# Patient Record
Sex: Female | Born: 1975 | Race: White | Hispanic: No | Smoking: Never smoker
Health system: Southern US, Community
[De-identification: ages and names within clinical notes are randomized; demographics above are authoritative.]

## PROBLEM LIST (undated history)

## (undated) DIAGNOSIS — R0602 Shortness of breath: Secondary | ICD-10-CM

## (undated) DIAGNOSIS — R079 Chest pain, unspecified: Secondary | ICD-10-CM

## (undated) DIAGNOSIS — E1165 Type 2 diabetes mellitus with hyperglycemia: Secondary | ICD-10-CM

## (undated) DIAGNOSIS — K5900 Constipation, unspecified: Secondary | ICD-10-CM

## (undated) DIAGNOSIS — R5383 Other fatigue: Secondary | ICD-10-CM

## (undated) DIAGNOSIS — E785 Hyperlipidemia, unspecified: Secondary | ICD-10-CM

## (undated) DIAGNOSIS — Z6832 Body mass index (BMI) 32.0-32.9, adult: Secondary | ICD-10-CM

## (undated) DIAGNOSIS — I10 Essential (primary) hypertension: Secondary | ICD-10-CM

## (undated) DIAGNOSIS — D691 Qualitative platelet defects: Secondary | ICD-10-CM

## (undated) DIAGNOSIS — E119 Type 2 diabetes mellitus without complications: Secondary | ICD-10-CM

## (undated) HISTORY — DX: Type 2 diabetes mellitus with hyperglycemia: E11.65

## (undated) HISTORY — DX: Shortness of breath: R06.02

## (undated) HISTORY — DX: Chest pain, unspecified: R07.9

## (undated) HISTORY — DX: Body mass index (BMI) 32.0-32.9, adult: Z68.32

## (undated) HISTORY — DX: Other fatigue: R53.83

## (undated) HISTORY — DX: Type 2 diabetes mellitus without complications: E11.9

## (undated) HISTORY — DX: Constipation, unspecified: K59.00

## (undated) HISTORY — DX: Qualitative platelet defects: D69.1

## (undated) HISTORY — DX: Hyperlipidemia, unspecified: E78.5

## (undated) HISTORY — DX: Essential (primary) hypertension: I10

---

## 1999-05-24 ENCOUNTER — Other Ambulatory Visit: Admission: RE | Admit: 1999-05-24 | Discharge: 1999-05-24 | Payer: Self-pay | Admitting: *Deleted

## 2000-08-08 ENCOUNTER — Other Ambulatory Visit: Admission: RE | Admit: 2000-08-08 | Discharge: 2000-08-08 | Payer: Self-pay | Admitting: Family Medicine

## 2005-03-06 ENCOUNTER — Other Ambulatory Visit: Admission: RE | Admit: 2005-03-06 | Discharge: 2005-03-06 | Payer: Self-pay | Admitting: Obstetrics & Gynecology

## 2005-06-14 ENCOUNTER — Inpatient Hospital Stay (HOSPITAL_COMMUNITY): Admission: AD | Admit: 2005-06-14 | Discharge: 2005-06-14 | Payer: Self-pay | Admitting: Obstetrics and Gynecology

## 2005-07-26 ENCOUNTER — Inpatient Hospital Stay (HOSPITAL_COMMUNITY): Admission: AD | Admit: 2005-07-26 | Discharge: 2005-07-26 | Payer: Self-pay | Admitting: Obstetrics & Gynecology

## 2005-07-29 ENCOUNTER — Inpatient Hospital Stay (HOSPITAL_COMMUNITY): Admission: AD | Admit: 2005-07-29 | Discharge: 2005-08-03 | Payer: Self-pay | Admitting: Obstetrics and Gynecology

## 2005-09-19 ENCOUNTER — Other Ambulatory Visit: Admission: RE | Admit: 2005-09-19 | Discharge: 2005-09-19 | Payer: Self-pay | Admitting: Obstetrics & Gynecology

## 2006-04-09 ENCOUNTER — Other Ambulatory Visit: Admission: RE | Admit: 2006-04-09 | Discharge: 2006-04-09 | Payer: Self-pay | Admitting: Obstetrics and Gynecology

## 2006-07-24 ENCOUNTER — Encounter: Admission: RE | Admit: 2006-07-24 | Discharge: 2006-07-24 | Payer: Self-pay | Admitting: Obstetrics and Gynecology

## 2006-09-05 ENCOUNTER — Inpatient Hospital Stay (HOSPITAL_COMMUNITY): Admission: AD | Admit: 2006-09-05 | Discharge: 2006-09-07 | Payer: Self-pay | Admitting: Obstetrics and Gynecology

## 2016-10-12 ENCOUNTER — Emergency Department (HOSPITAL_COMMUNITY)
Admission: EM | Admit: 2016-10-12 | Discharge: 2016-10-12 | Disposition: A | Discharge status: Home / Self Care | Payer: Medicaid Other | Attending: Emergency Medicine | Admitting: Emergency Medicine

## 2016-10-12 ENCOUNTER — Encounter (HOSPITAL_COMMUNITY): Payer: Self-pay | Admitting: *Deleted

## 2016-10-12 ENCOUNTER — Emergency Department (HOSPITAL_COMMUNITY): Payer: Medicaid Other

## 2016-10-12 DIAGNOSIS — S161XXA Strain of muscle, fascia and tendon at neck level, initial encounter: Secondary | ICD-10-CM | POA: Diagnosis not present

## 2016-10-12 DIAGNOSIS — Y999 Unspecified external cause status: Secondary | ICD-10-CM | POA: Diagnosis not present

## 2016-10-12 DIAGNOSIS — Y939 Activity, unspecified: Secondary | ICD-10-CM | POA: Diagnosis not present

## 2016-10-12 DIAGNOSIS — S93401A Sprain of unspecified ligament of right ankle, initial encounter: Secondary | ICD-10-CM | POA: Insufficient documentation

## 2016-10-12 DIAGNOSIS — S199XXA Unspecified injury of neck, initial encounter: Secondary | ICD-10-CM | POA: Diagnosis present

## 2016-10-12 DIAGNOSIS — W11XXXA Fall on and from ladder, initial encounter: Secondary | ICD-10-CM | POA: Diagnosis not present

## 2016-10-12 DIAGNOSIS — W19XXXA Unspecified fall, initial encounter: Secondary | ICD-10-CM

## 2016-10-12 DIAGNOSIS — Y929 Unspecified place or not applicable: Secondary | ICD-10-CM | POA: Insufficient documentation

## 2016-10-12 MED ORDER — METHOCARBAMOL 500 MG PO TABS: 500.0000 mg | ORAL_TABLET | Freq: Two times a day (BID) | ORAL | 0 refills | Status: DC | PRN

## 2016-10-12 MED ORDER — IBUPROFEN 800 MG PO TABS
800.0000 mg | ORAL_TABLET | Freq: Once | ORAL | Status: AC
  Administered 2016-10-12: 800 mg via ORAL
  Filled 2016-10-12: qty 1

## 2016-10-12 MED ORDER — IBUPROFEN 800 MG PO TABS: 800.0000 mg | ORAL_TABLET | Freq: Three times a day (TID) | ORAL | 0 refills | Status: DC | PRN

## 2016-10-12 NOTE — ED Triage Notes (Signed)
The pt fell 0900 am this am  She was inside standing on a ladder missed step   And fell onto the floor.  She is c/o neck pain upper and lower spine pain and rt foot  lmp 3 weeks ago

## 2016-10-12 NOTE — ED Notes (Signed)
Pt. Returned from XR 

## 2016-10-12 NOTE — ED Notes (Signed)
Patient transported to X-ray 

## 2016-10-12 NOTE — ED Triage Notes (Signed)
No loc

## 2016-10-12 NOTE — ED Notes (Signed)
ED Provider at bedside. 

## 2016-10-12 NOTE — Progress Notes (Signed)
Orthopedic Tech Progress Note Patient Details:  Lehman PromMelinda A Hardigree 10-24-1976 [id number]  Ortho Devices Type of Ortho Device: ASO Ortho Device/Splint Location: rle Ortho Device/Splint Interventions: Ordered, Application   Trinna PostMartinez, Emanie Behan J 10/12/2016, 9:20 PM

## 2016-10-12 NOTE — Discharge Instructions (Signed)
Ibuprofen as needed for pain. Robaxin is your muscle relaxer to take as needed for pain/muscle spasms - This can make you very drowsy - please do not drink alcohol, operate heavy machinery or drive on this medication. Please follow-up with your primary care provider if symptoms persist longer than one week. Ice affected area for additional pain relief. Return to ER for new or worsening symptoms, any additional concerns.  COLD THERAPY DIRECTIONS:  Ice or gel packs can be used to reduce both pain and swelling. Ice is the most helpful within the first 24 to 48 hours after an injury or flareup from overusing a muscle or joint.  Ice is effective, has very few side effects, and is safe for most people to use.   If you expose your skin to cold temperatures for too long or without the proper protection, you can damage your skin or nerves. Watch for signs of skin damage due to cold.   HOME CARE INSTRUCTIONS  Follow these tips to use ice and cold packs safely.  Place a dry or damp towel between the ice and skin. A damp towel will cool the skin more quickly, so you may need to shorten the time that the ice is used.  For a more rapid response, add gentle compression to the ice.  Ice for no more than 10 to 20 minutes at a time. The bonier the area you are icing, the less time it will take to get the benefits of ice.  Check your skin after 5 minutes to make sure there are no signs of a poor response to cold or skin damage.  Rest 20 minutes or more in between uses.  Once your skin is numb, you can end your treatment. You can test numbness by very lightly touching your skin. The touch should be so light that you do not see the skin dimple from the pressure of your fingertip. When using ice, most people will feel these normal sensations in this order: cold, burning, aching, and numbness.

## 2016-10-12 NOTE — ED Provider Notes (Signed)
[facility]-EMERGENCY DEPT Provider Note   CSN: [id number] Arrival date & time: 10/12/16  1643     History   Chief Complaint Chief Complaint  Patient presents with  . Fall    HPI Vanessa Parks is a 40 y.o. female.  The history is provided by the patient and medical records. No language interpreter was used.  Fall  Pertinent negatives include no abdominal pain, no headaches and no shortness of breath.   Vanessa Parks is an otherwise healthy 40 y.o. female  who presents to the Emergency Department for evaluation after a fall at 9am this morning. Patient states that she was standing on a ladder when she missed a step, hitting her low back on the ladder and her right neck on the window pane next to the ladder. She also believes her right foot twisted oddly during the fall. She took a nap, hoping to "sleep it off" but when she awoke, neck/back/right foot pain worsened, emptying her to come to ER for further evaluation. She has taken no medications prior to arrival for pain. She denies head injury, loss of consciousness, saddle anesthesia, numbness or tingling, muscle weakness, abdominal pain, any additional complaints.  History reviewed. No pertinent past medical history.  There are no active problems to display for this patient.   History reviewed. No pertinent surgical history.  OB History    No data available       Home Medications    Prior to Admission medications   Medication Sig Start Date End Date Taking? Authorizing Provider  ibuprofen (ADVIL,MOTRIN) 800 MG tablet Take 1 tablet (800 mg total) by mouth every 8 (eight) hours as needed. 10/12/16   Chase Picket Link Burgeson, PA-C  methocarbamol (ROBAXIN) 500 MG tablet Take 1 tablet (500 mg total) by mouth 2 (two) times daily as needed for muscle spasms. 10/12/16   Chase Picket Katherina Wimer, PA-C    Family History No family history on file.  Social History Social History  Substance Use Topics  . Smoking status: Never Smoker  .  Smokeless tobacco: Never Used  . Alcohol use No     Allergies   Review of patient's allergies indicates no known allergies.   Review of Systems Review of Systems  Constitutional: Negative for chills and fever.  HENT: Negative for congestion.   Eyes: Negative for visual disturbance.  Respiratory: Negative for shortness of breath.   Cardiovascular: Negative.   Gastrointestinal: Negative for abdominal pain, nausea and vomiting.  Musculoskeletal: Positive for arthralgias, back pain and neck pain.  Skin: Negative for color change and wound.  Neurological: Negative for syncope and headaches.  Hematological: Does not bruise/bleed easily.     Physical Exam Updated Vital Signs BP 136/82 (BP Location: Right Arm)   Pulse 98   Temp 98.5 F (36.9 C) (Oral)   Resp 18   Ht 5' 8.5" (1.74 m)   Wt 99.4 kg   LMP 09/21/2016 Comment: tubal ligation  SpO2 100%   BMI 32.82 kg/m   Physical Exam  Constitutional: She is oriented to person, place, and time. She appears well-developed and well-nourished. No distress.  HENT:  Head: Normocephalic and atraumatic.  Neck:  No midline tenderness. Tenderness to palpation of the left paraspinal musculature. + spasm.  Cardiovascular: Normal rate, regular rhythm, normal heart sounds and intact distal pulses.   No murmur heard. Pulmonary/Chest: Effort normal and breath sounds normal. No respiratory distress. She has no wheezes. She has no rales. She exhibits no tenderness.  Abdominal: Soft. Bowel  sounds are normal. She exhibits no distension. There is no tenderness.  Musculoskeletal:       Arms: Right foot/ankle: No gross deformity noted. Patient has full ROM. There is no joint effusion noted. There is tenderness to palpation over the medial malleolus. No pain to fifth metatarsal area, no pain to navicular region. 2+ DP pulses, sensation intact to medial, lateral, dorsal and plantar aspects. Straight leg raises negative bilaterally for radicular  symptoms. 5/5 muscle strength 4 extremities.  Neurological: She is alert and oriented to person, place, and time.  All 4 extremities neurovascularly intact.  Skin: Skin is warm and dry.  Nursing note and vitals reviewed.    ED Treatments / Results  Labs (all labs ordered are listed, but only abnormal results are displayed) Labs Reviewed - No data to display  EKG  EKG Interpretation None       Radiology Dg Lumbar Spine Complete  Result Date: 10/12/2016 CLINICAL DATA:  Fall.  Low back pain. EXAM: LUMBAR SPINE - COMPLETE 4+ VIEW COMPARISON:  None. FINDINGS: This report assumes 5 non rib-bearing lumbar vertebrae. Lumbar vertebral body heights are preserved, with no fracture. Mild spondylosis deformans throughout the lumbar spine. No spondylolisthesis. No appreciable facet arthropathy. No aggressive appearing focal osseous lesions. IMPRESSION: 1. No lumbar spine fracture or spondylolisthesis. 2. Mild lumbar spondylosis deformans. Electronically Signed   By: Delbert PhenixJason A Poff M.D.   On: 10/12/2016 18:58   Dg Ankle Complete Right  Result Date: 10/12/2016 CLINICAL DATA:  Initial encounter for Medial and lateral right ankle pain x6 hours s/p fall inside on hardwood flooring. Pt states she lost her balance and fell while trying to sit in a chair. No hx of right ankle injuries or surgeries. EXAM: RIGHT ANKLE - COMPLETE 3+ VIEW COMPARISON:  None. FINDINGS: No acute fracture or dislocation. Small Achilles and calcaneal spurs. No definite soft tissue swelling. IMPRESSION: No acute osseous abnormality. Electronically Signed   By: Jeronimo GreavesKyle  Talbot M.D.   On: 10/12/2016 18:56    Procedures Procedures (including critical care time)  Medications Ordered in ED Medications  ibuprofen (ADVIL,MOTRIN) tablet 800 mg (800 mg Oral Given 10/12/16 1814)     Initial Impression / Assessment and Plan / ED Course  I have reviewed the triage vital signs and the nursing notes.  Pertinent labs & imaging results that  were available during my care of the patient were reviewed by me and considered in my medical decision making (see chart for details).  Clinical Course   Vanessa Parks is a 40 y.o. female who presents to ED for evaluation of neck pain, back pain and right foot pain following a fall earlier this morning. On exam, patient is very well-appearing. Neck exam consistent with left muscle strain. Ibuprofen given in ED which provided adequate relief. She does exhibit some midline lumbar tenderness along with paraspinal tenderness. X-ray obtained which shows no acute abnormalities. Ankle x-ray obtained which was negative, likely sprain. ASO brace provided in ED. Offered crutches, but patient states that she can walk without assistance.  Symptomatic home care instructions were discussed. Rx for ibuprofen and Robaxin given. PCP follow-up if her symptoms persist. Reasons to return to ED discussed and all questions answered.   Final Clinical Impressions(s) / ED Diagnoses   Final diagnoses:  Strain of neck muscle, initial encounter  Fall, initial encounter  Sprain of right ankle, unspecified ligament, initial encounter    New Prescriptions New Prescriptions   IBUPROFEN (ADVIL,MOTRIN) 800 MG TABLET    Take  1 tablet (800 mg total) by mouth every 8 (eight) hours as needed.   METHOCARBAMOL (ROBAXIN) 500 MG TABLET    Take 1 tablet (500 mg total) by mouth 2 (two) times daily as needed for muscle spasms.     Chase PicketJaime Pilcher Jovannie Ulibarri, PA-C 10/12/16 1931    Shaune Pollackameron Isaacs, MD 10/13/16 [phone]

## 2017-08-16 IMAGING — DX DG LUMBAR SPINE COMPLETE 4+V
5 series · 5 of 5 positions shown · non-contrast
Comparison: None.

CLINICAL DATA: Fall.  Low back pain.

EXAM:
LUMBAR SPINE - COMPLETE 4+ VIEW

[l-spine ap]
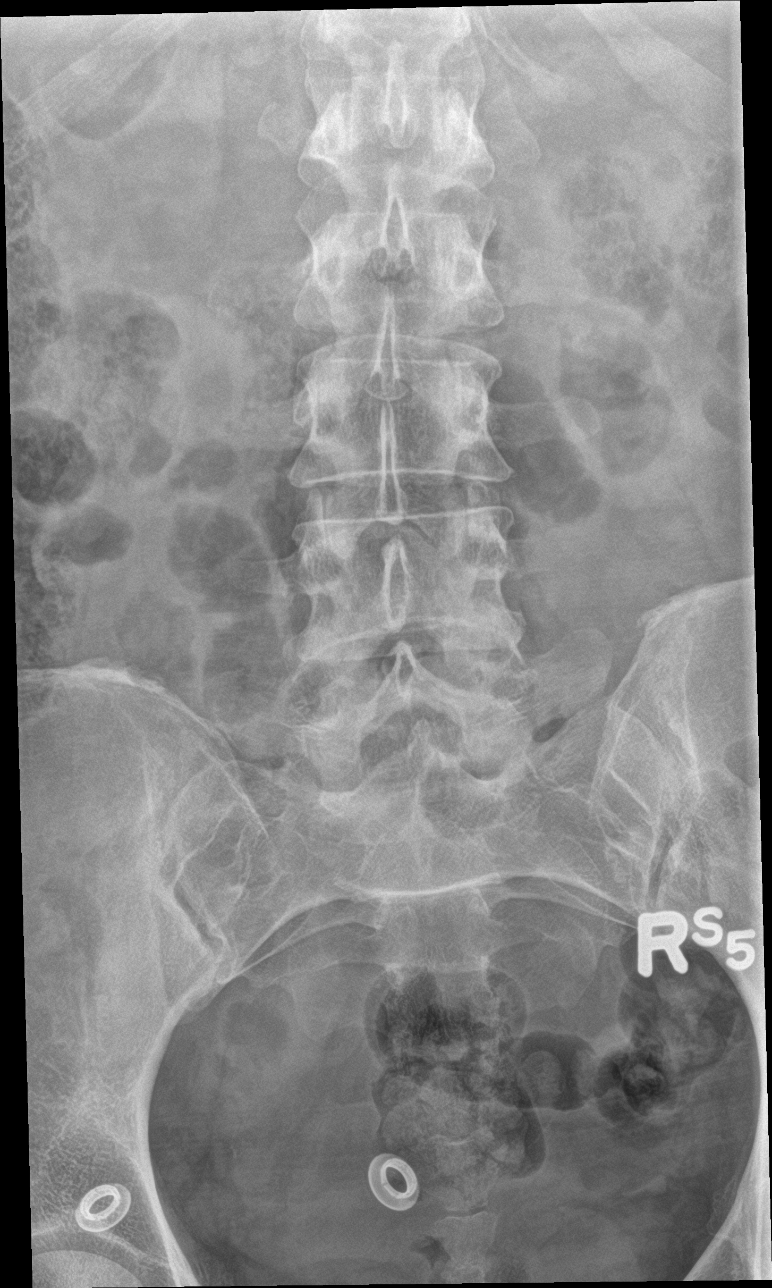

[l-spine obl (1 of 2)]
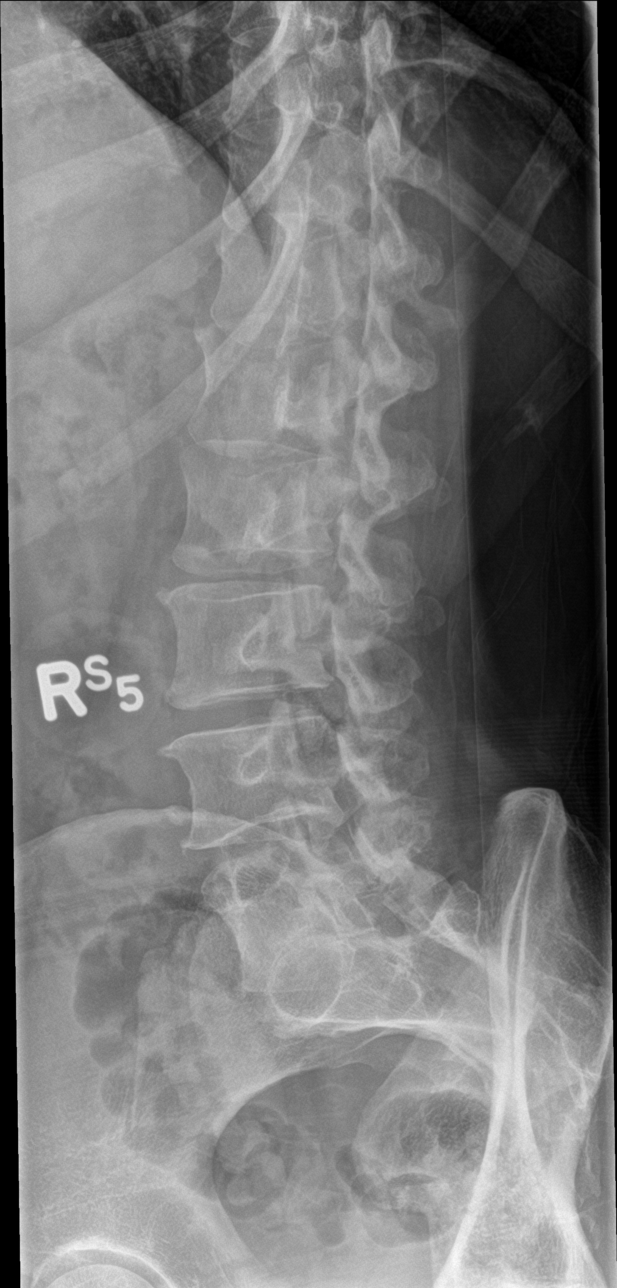

[l-spine obl (2 of 2)]
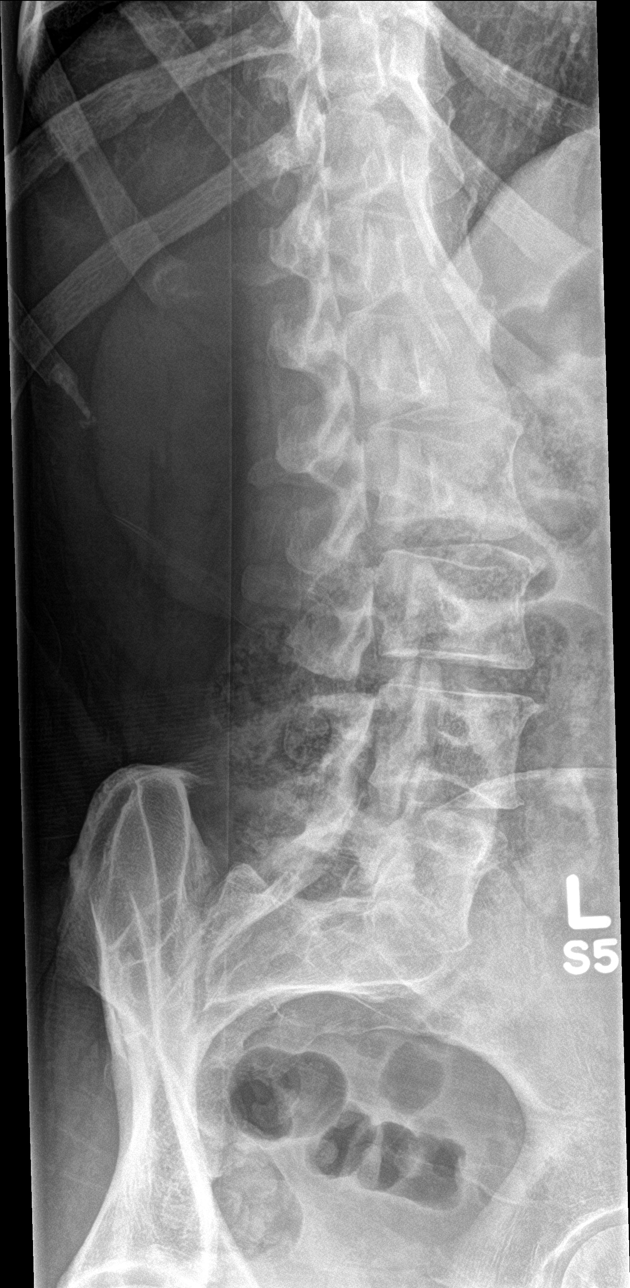

[l-spine lat]
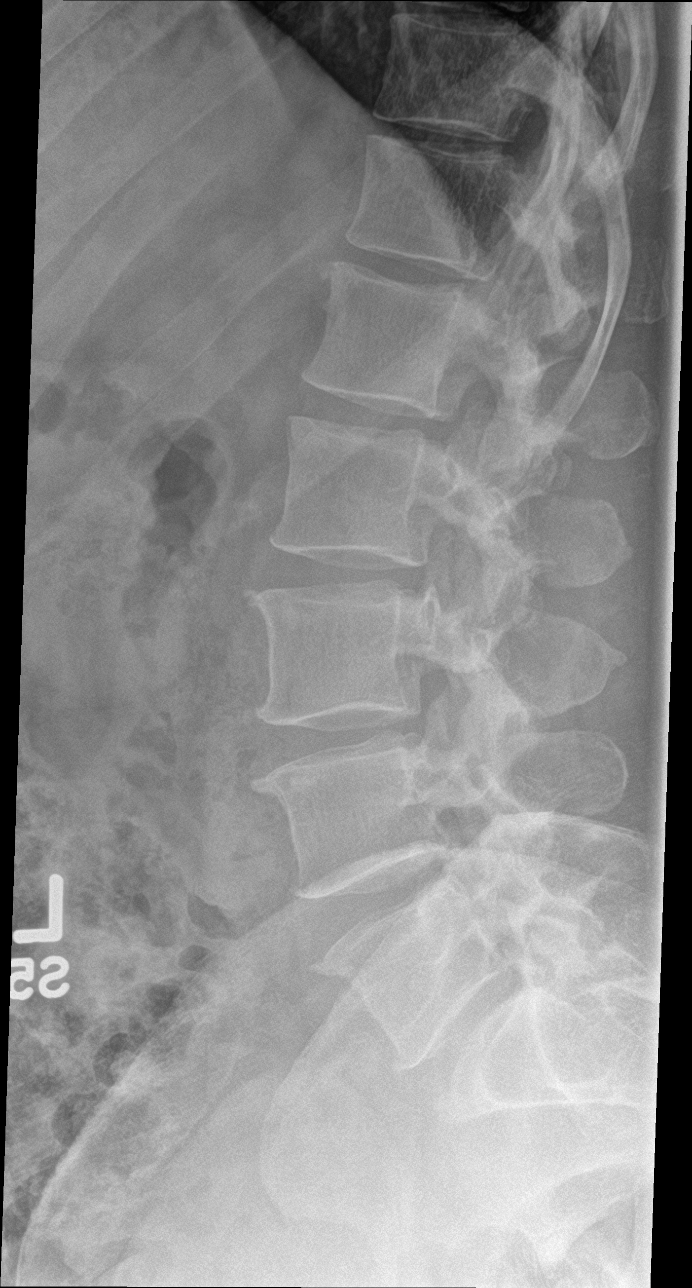

[l-spine spot]
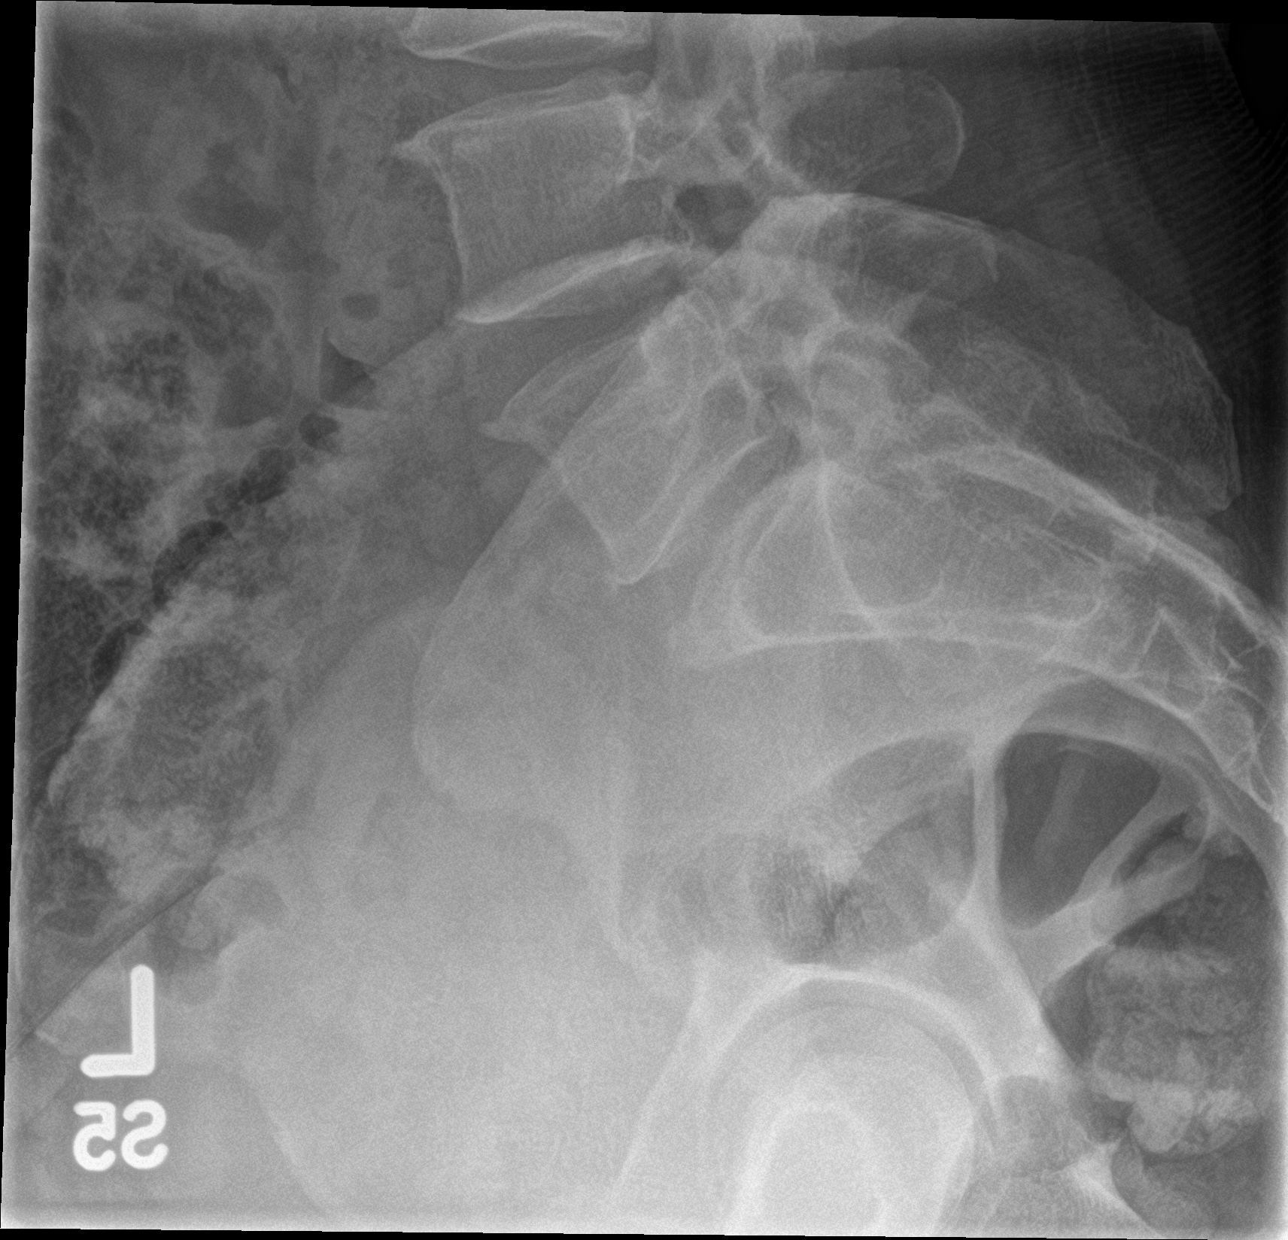

[5 of 5 positions shown; findings below may reference images not displayed]

FINDINGS: This report assumes 5 non rib-bearing lumbar vertebrae.

Lumbar vertebral body heights are preserved, with no fracture.

Mild spondylosis deformans throughout the lumbar spine. No
spondylolisthesis. No appreciable facet arthropathy. No aggressive
appearing focal osseous lesions.
IMPRESSION: 1. No lumbar spine fracture or spondylolisthesis.
2. Mild lumbar spondylosis deformans.

## 2019-01-09 HISTORY — PX: PARTIAL HYSTERECTOMY: SHX80

## 2019-06-03 DIAGNOSIS — D509 Iron deficiency anemia, unspecified: Secondary | ICD-10-CM | POA: Diagnosis not present

## 2020-08-28 DIAGNOSIS — Z90710 Acquired absence of both cervix and uterus: Secondary | ICD-10-CM

## 2020-08-28 HISTORY — DX: Acquired absence of both cervix and uterus: Z90.710

## 2022-06-04 ENCOUNTER — Other Ambulatory Visit: Payer: Self-pay

## 2022-06-04 DIAGNOSIS — K5900 Constipation, unspecified: Secondary | ICD-10-CM | POA: Insufficient documentation

## 2022-06-04 DIAGNOSIS — I10 Essential (primary) hypertension: Secondary | ICD-10-CM | POA: Insufficient documentation

## 2022-06-04 DIAGNOSIS — E1165 Type 2 diabetes mellitus with hyperglycemia: Secondary | ICD-10-CM | POA: Insufficient documentation

## 2022-06-04 DIAGNOSIS — D691 Qualitative platelet defects: Secondary | ICD-10-CM | POA: Insufficient documentation

## 2022-06-04 DIAGNOSIS — R0602 Shortness of breath: Secondary | ICD-10-CM | POA: Insufficient documentation

## 2022-06-04 DIAGNOSIS — E785 Hyperlipidemia, unspecified: Secondary | ICD-10-CM | POA: Insufficient documentation

## 2022-06-04 DIAGNOSIS — Z6832 Body mass index (BMI) 32.0-32.9, adult: Secondary | ICD-10-CM | POA: Insufficient documentation

## 2022-06-04 DIAGNOSIS — R079 Chest pain, unspecified: Secondary | ICD-10-CM | POA: Insufficient documentation

## 2022-06-04 DIAGNOSIS — E119 Type 2 diabetes mellitus without complications: Secondary | ICD-10-CM | POA: Insufficient documentation

## 2022-06-04 DIAGNOSIS — R5383 Other fatigue: Secondary | ICD-10-CM | POA: Insufficient documentation

## 2022-06-07 ENCOUNTER — Emergency Department (HOSPITAL_COMMUNITY): Payer: Medicaid Other

## 2022-06-07 ENCOUNTER — Other Ambulatory Visit: Payer: Self-pay

## 2022-06-07 ENCOUNTER — Encounter (HOSPITAL_COMMUNITY): Payer: Self-pay

## 2022-06-07 ENCOUNTER — Emergency Department (HOSPITAL_COMMUNITY)
Admission: EM | Admit: 2022-06-07 | Discharge: 2022-06-07 | Disposition: A | Discharge status: Home / Self Care | Payer: Medicaid Other | Attending: Emergency Medicine | Admitting: Emergency Medicine

## 2022-06-07 DIAGNOSIS — R0789 Other chest pain: Secondary | ICD-10-CM | POA: Insufficient documentation

## 2022-06-07 DIAGNOSIS — E1165 Type 2 diabetes mellitus with hyperglycemia: Secondary | ICD-10-CM | POA: Diagnosis not present

## 2022-06-07 DIAGNOSIS — R002 Palpitations: Secondary | ICD-10-CM | POA: Diagnosis not present

## 2022-06-07 DIAGNOSIS — I10 Essential (primary) hypertension: Secondary | ICD-10-CM | POA: Insufficient documentation

## 2022-06-07 DIAGNOSIS — R739 Hyperglycemia, unspecified: Secondary | ICD-10-CM

## 2022-06-07 DIAGNOSIS — Z794 Long term (current) use of insulin: Secondary | ICD-10-CM | POA: Diagnosis not present

## 2022-06-07 DIAGNOSIS — Z79899 Other long term (current) drug therapy: Secondary | ICD-10-CM | POA: Diagnosis not present

## 2022-06-07 DIAGNOSIS — M5440 Lumbago with sciatica, unspecified side: Secondary | ICD-10-CM | POA: Diagnosis not present

## 2022-06-07 LAB — D-DIMER, QUANTITATIVE: D-Dimer, Quant: <0.27 ug/mL-FEU (ref 0.00–0.50)

## 2022-06-07 LAB — CBC
HCT: 45.7 % (ref 36.0–46.0)
Hemoglobin: 15 g/dL (ref 12.0–15.0)
MCH: 27.9 pg (ref 26.0–34.0)
MCHC: 32.8 g/dL (ref 30.0–36.0)
MCV: 84.9 fL (ref 80.0–100.0)
Platelets: 253 10*3/uL (ref 150–400)
RBC: 5.38 MIL/uL — ABNORMAL HIGH (ref 3.87–5.11)
RDW: 12.6 % (ref 11.5–15.5)
WBC: 8.9 10*3/uL (ref 4.0–10.5)
nRBC: 0 % (ref 0.0–0.2)

## 2022-06-07 LAB — TROPONIN I (HIGH SENSITIVITY)
Troponin I (High Sensitivity): 4 ng/L (ref <18)
Troponin I (High Sensitivity): 3 ng/L (ref <18)

## 2022-06-07 LAB — BASIC METABOLIC PANEL
Anion gap: 10 (ref 5–15)
BUN: 6 mg/dL (ref 6–20)
CO2: 25 mmol/L (ref 22–32)
Calcium: 9.3 mg/dL (ref 8.9–10.3)
Chloride: 102 mmol/L (ref 98–111)
Creatinine, Ser: 0.73 mg/dL (ref 0.44–1.00)
GFR, Estimated: >60 mL/min (ref >60)
Glucose, Bld: 190 mg/dL — ABNORMAL HIGH (ref 70–99)
Potassium: 4.2 mmol/L (ref 3.5–5.1)
Sodium: 137 mmol/L (ref 135–145)

## 2022-06-07 LAB — I-STAT BETA HCG BLOOD, ED (MC, WL, AP ONLY): I-stat hCG, quantitative: <5 m[IU]/mL (ref <5)

## 2022-06-07 MED ORDER — IBUPROFEN 600 MG PO TABS: 600.0000 mg | ORAL_TABLET | Freq: Four times a day (QID) | ORAL | 0 refills | Status: AC | PRN

## 2022-06-07 MED ORDER — CYCLOBENZAPRINE HCL 10 MG PO TABS: 10.0000 mg | ORAL_TABLET | Freq: Two times a day (BID) | ORAL | 0 refills | Status: DC | PRN

## 2022-06-07 MED ORDER — IBUPROFEN 200 MG PO TABS
600.0000 mg | ORAL_TABLET | Freq: Once | ORAL | Status: AC
  Administered 2022-06-07: 600 mg via ORAL
  Filled 2022-06-07: qty 1

## 2022-06-07 NOTE — ED Provider Triage Note (Signed)
Emergency Medicine Provider Triage Evaluation Note  Vanessa Parks , a 46 y.o. female  was evaluated in triage.  Pt complains of CP for one week w SOB and nausea without vomiting.    Review of Systems  Positive: CP/SOB, nauseated Negative: Fever   Physical Exam  BP (!) 140/93 (BP Location: Left Arm)   Pulse (!) 106   Temp 98 F (36.7 C) (Oral)   Resp 16   Ht 5' 8.5" (1.74 m)   Wt 99 kg   SpO2 98%   BMI 32.70 kg/m  Gen:   Awake, no distress   Resp:  Normal effort  MSK:   Moves extremities without difficulty  Other:  Lungs clear  Medical Decision Making  Medically screening exam initiated at 9:25 AM.  Appropriate orders placed.  Vanessa Parks was informed that the remainder of the evaluation will be completed by another provider, this initial triage assessment does not replace that evaluation, and the importance of remaining in the ED until their evaluation is complete.  Labs and ekg   Vanessa Parks, [place] 06/07/22 [phone]

## 2022-06-07 NOTE — ED Triage Notes (Signed)
Pt arrives POV for eval of  R chest pain x 1 week w/ associated SOB and nausea, also reports so R thigh numbnesstingling x 3 months. Reports CP x 1 week, denies alleviating/aggravating factors. Pt reports thigh tingling is getting worse and last night radiated to other thigh

## 2022-06-07 NOTE — ED Notes (Signed)
Pt reported their pulse was 150, with their personal equipment, vitals rechecked and pulse was 99. Pt told to notify staff if it were to happen again.

## 2022-06-07 NOTE — ED Provider Notes (Signed)
[facility] EMERGENCY DEPARTMENT Provider Note   CSN: [id number] Arrival date & time: 06/07/22  0901     History  Chief Complaint  Patient presents with   Chest Pain    Vanessa Parks is a 46 y.o. female.   Chest Pain   Patient presents to the ED with complaints of palpitations and right-sided chest discomfort.  Patient states she has been having intermittent symptoms for the past week.  Patient will feel pounding on the right side of her chest.  She has not been having any fevers or chills.  No shortness of breath.  No leg swelling.  Patient does have a history of hypertension and diabetes as well as hypercholesterolemia.  No known history of heart disease.  No history of PE or DVT.  Patient states she has checked her heart rate at home and it was up into the 115.  However she states she had an episode today where the pulse was about 150 based on a home pulse ox monitor.  Patient states she is also has been having some pain in the lower back.  She has had some discomfort that is gone into her left leg but more recently it went into her right leg as well.  She is not having any numbness or weakness.  No difficulty walking.  She has not noticed any leg swelling  Home Medications Prior to Admission medications   Medication Sig Start Date End Date Taking? Authorizing Provider  cyclobenzaprine (FLEXERIL) 10 MG tablet Take 1 tablet (10 mg total) by mouth 2 (two) times daily as needed for muscle spasms. 06/07/22  Yes Linwood Dibbles, MD  ibuprofen (ADVIL) 600 MG tablet Take 1 tablet (600 mg total) by mouth every 6 (six) hours as needed. 06/07/22  Yes Linwood Dibbles, MD  insulin aspart (NOVOLOG) 100 UNIT/ML FlexPen Inject 12 Units into the skin as needed for high blood sugar. Sliding scale    [provider]  linaclotide (LINZESS) 145 MCG CAPS capsule Take 1 tablet by mouth daily. 08/10/20   [provider]  methocarbamol (ROBAXIN) 500 MG tablet Take 1 tablet (500  mg total) by mouth 2 (two) times daily as needed for muscle spasms. 10/12/16   Ward, Chase Picket, PA-C  olmesartan (BENICAR) 20 MG tablet Take 20 mg by mouth daily. 05/29/22   [provider]  OZEMPIC, 0.25 OR 0.5 MG/DOSE, 2 MG/3ML SOPN Inject into the skin. 06/03/22   [provider]  rosuvastatin (CRESTOR) 10 MG tablet Take 10 mg by mouth at bedtime. 05/30/22   [provider]      Allergies    Lipitor [atorvastatin] and Victoza [liraglutide]    Review of Systems   Review of Systems  Cardiovascular:  Positive for chest pain.    Physical Exam Updated Vital Signs BP (!) 148/96 (BP Location: Right Arm)   Pulse 100   Temp 98.4 F (36.9 C) (Oral)   Resp 18   Ht 1.74 m (5' 8.5")   Wt 99 kg   SpO2 98%   BMI 32.70 kg/m  Physical Exam Vitals and nursing note reviewed.  Constitutional:      General: She is not in acute distress.    Appearance: She is well-developed.  HENT:     Head: Normocephalic and atraumatic.     Right Ear: External ear normal.     Left Ear: External ear normal.  Eyes:     General: No scleral icterus.  Right eye: No discharge.        Left eye: No discharge.     Conjunctiva/sclera: Conjunctivae normal.  Neck:     Trachea: No tracheal deviation.  Cardiovascular:     Rate and Rhythm: Normal rate and regular rhythm.  Pulmonary:     Effort: Pulmonary effort is normal. No respiratory distress.     Breath sounds: Normal breath sounds. No stridor. No wheezing or rales.  Abdominal:     General: Bowel sounds are normal. There is no distension.     Palpations: Abdomen is soft.     Tenderness: There is no abdominal tenderness. There is no guarding or rebound.  Musculoskeletal:        General: No tenderness or deformity.     Cervical back: Neck supple.     Right lower leg: No tenderness. No edema.     Left lower leg: No tenderness. No edema.     Comments: , Mild tenderness palpation paraspinal region lumbar spine  Skin:     General: Skin is warm and dry.     Findings: No rash.  Neurological:     General: No focal deficit present.     Mental Status: She is alert.     Cranial Nerves: No cranial nerve deficit (no facial droop, extraocular movements intact, no slurred speech).     Sensory: No sensory deficit.     Motor: No abnormal muscle tone or seizure activity.     Coordination: Coordination normal.     Comments: Normal strength and sensation bilateral lower extremities  Psychiatric:        Mood and Affect: Mood normal.     ED Results / Procedures / Treatments   Labs (all labs ordered are listed, but only abnormal results are displayed) Labs Reviewed  BASIC METABOLIC PANEL - Abnormal; Notable for the following components:      Result Value   Glucose, Bld 190 (*)    All other components within normal limits  CBC - Abnormal; Notable for the following components:   RBC 5.38 (*)    All other components within normal limits  D-DIMER, QUANTITATIVE  I-STAT BETA HCG BLOOD, ED ([facility], [facility], [facility] ONLY)  TROPONIN I (HIGH SENSITIVITY)  TROPONIN I (HIGH SENSITIVITY)    EKG EKG Interpretation  Date/Time:  Friday June 07 2022 09:19:07 EDT Ventricular Rate:  94 PR Interval:  156 QRS Duration: 78 QT Interval:  354 QTC Calculation: 442 R Axis:   52 Text Interpretation: Normal sinus rhythm Septal infarct , age undetermined Abnormal ECG No previous ECGs available Confirmed by Linwood Dibbles [phone]) on 06/07/2022 4:25:44 PM  Radiology DG Chest 2 View  Result Date: 06/07/2022 CLINICAL DATA:  Provided history: Chest pain. Additional history provided: Patient reports right-sided chest pain for 1 week with associated shortness of breath and nausea. EXAM: CHEST - 2 VIEW COMPARISON:  Prior chest radiograph 05/29/2022 and earlier. FINDINGS: Heart size within normal limits. No appreciable airspace consolidation. No evidence of pleural effusion or pneumothorax. No acute bony abnormality identified. Mild dextrocurvature of the mid  to lower thoracic spine. IMPRESSION: No evidence of acute cardiopulmonary abnormality. Electronically Signed   By: Jackey Loge D.O.   On: 06/07/2022 09:51    Procedures Procedures    Medications Ordered in ED Medications  ibuprofen (ADVIL) tablet 600 mg (has no administration in time range)    ED Course/ Medical Decision Making/ A&P Clinical Course as of 06/07/22 1705  Fri Jun 07, 2022  1545 Basic metabolic  panel(!) Blood sugar elevated [JK]  1545 CBC(!) Normal [JK]  1545 D-dimer, quantitative Normal [JK]  1545 Chest x-ray images and radiology report reviewed no acute findings [JK]  1545 Troponin I (High Sensitivity) Serial troponins normal [JK]    Clinical Course User Index [JK] Linwood Dibbles, MD           HEART Score: 3                Medical Decision Making Amount and/or Complexity of Data Reviewed Labs: ordered. Decision-making details documented in ED Course. Radiology: ordered and independent interpretation performed. Decision-making details documented in ED Course.  Risk Prescription drug management.   Patient presented to the ED for evaluation of palpitations, chest discomfort as well as lower back pain.  Patient described episodes of tachycardia and right-sided chest discomfort.  ED work-up reassuring.  Low risk heart score.  No findings to suggest PE, acute coronary syndrome.  No pneumonia or pneumothorax.  Patient describes episode of tachycardia.  She may benefit from outpatient cardiac monitoring.  Patient also complained of some low back pain.  She is having some pain rating down her legs.  Suggest.  She has normal strength and sensation.  Doubt any acute neurologic dysfunction requiring more advanced imaging or neurosurgical intervention.  Evaluation and diagnostic testing in the emergency department does not suggest an emergent condition requiring admission or immediate intervention beyond what has been performed at this time.  The patient is safe for discharge  and has been instructed to return immediately for worsening symptoms, change in symptoms or any other concerns.         Final Clinical Impression(s) / ED Diagnoses Final diagnoses:  Palpitations  Hyperglycemia  Acute low back pain with sciatica, sciatica laterality unspecified, unspecified back pain laterality    Rx / DC Orders ED Discharge Orders          Ordered    ibuprofen (ADVIL) 600 MG tablet  Every 6 hours PRN        06/07/22 1652    cyclobenzaprine (FLEXERIL) 10 MG tablet  2 times daily PRN        06/07/22 1652              Linwood Dibbles, MD 06/07/22 1705

## 2022-06-07 NOTE — Discharge Instructions (Addendum)
The test in the ED today were reassuring.  Take the medications as prescribed to help with the pain and discomfort of your possible sciatica.  Follow-up with your primary care doctor to follow-up on your palpitations and chest discomfort.  If you continue to have episodes it would be reasonable to have an outpatient cardiac monitor set up.

## 2022-07-04 ENCOUNTER — Ambulatory Visit (INDEPENDENT_AMBULATORY_CARE_PROVIDER_SITE_OTHER): Payer: Medicaid Other | Admitting: Cardiovascular Disease

## 2022-07-04 ENCOUNTER — Encounter: Payer: Self-pay | Admitting: Cardiovascular Disease

## 2022-07-04 VITALS — BP 113/77 | HR 87 | Ht 68.0 in | Wt 210.4 lb

## 2022-07-04 DIAGNOSIS — R072 Precordial pain: Secondary | ICD-10-CM | POA: Diagnosis not present

## 2022-07-04 DIAGNOSIS — I10 Essential (primary) hypertension: Secondary | ICD-10-CM

## 2022-07-04 DIAGNOSIS — E1165 Type 2 diabetes mellitus with hyperglycemia: Secondary | ICD-10-CM

## 2022-07-04 DIAGNOSIS — E7800 Pure hypercholesterolemia, unspecified: Secondary | ICD-10-CM | POA: Diagnosis not present

## 2022-07-04 MED ORDER — METOPROLOL TARTRATE 100 MG PO TABS: ORAL_TABLET | ORAL | 0 refills | Status: DC

## 2022-07-04 MED ORDER — CYCLOBENZAPRINE HCL 10 MG PO TABS: 10.0000 mg | ORAL_TABLET | Freq: Three times a day (TID) | ORAL | 0 refills | Status: DC | PRN

## 2022-07-04 NOTE — Patient Instructions (Signed)
Medication Instructions:  No changes  *If you need a refill on your cardiac medications before your next appointment, please call your pharmacy*   Lab Work: Your provider would like for you to have the following labs today: BMET  If you have labs (blood work) drawn today and your tests are completely normal, you will receive your results only by: MyChart Message (if you have MyChart) OR A paper copy in the mail If you have any lab test that is abnormal or we need to change your treatment, we will call you to review the results.   Follow-Up: At [facility], you and your health needs are our priority.  As part of our continuing mission to provide you with exceptional heart care, we have created designated Provider Care Teams.  These Care Teams include your primary Cardiologist (physician) and Advanced Practice Providers (APPs -  Physician Assistants and Nurse Practitioners) who all work together to provide you with the care you need, when you need it.  We recommend signing up for the patient portal called "MyChart".  Sign up information is provided on this After Visit Summary.  MyChart is used to connect with patients for Virtual Visits (Telemedicine).  Patients are able to view lab/test results, encounter notes, upcoming appointments, etc.  Non-urgent messages can be sent to your provider as well.   To learn more about what you can do with MyChart, go to [email or web address].    Your next appointment:   3 month(s)  The format for your next appointment:   In Person  Provider:   Dr. Royann Shivers  Other Instructions   Your cardiac CT will be scheduled at one of the below locations:   [facility] [address] [place], [place] [address] [phone]  OR  [facility] [address] [place], [place] [address] [phone]  If scheduled at [facility], please arrive at the [facility]  (Entrance C2) of [facility] 30 minutes prior to test start time. You can use the FREE valet parking offered at entrance C (encouraged to control the heart rate for the test)  Proceed to the [facility] Radiology Department (first floor) to check-in and test prep.  All radiology patients and guests should use entrance C2 at [facility], accessed from [facility], even though the hospital's physical address listed is [address]    If scheduled at [facility], please arrive 15 mins early for check-in and test prep.  Please follow these instructions carefully (unless otherwise directed):   On the Night Before the Test: Be sure to Drink plenty of water. Do not consume any caffeinated/decaffeinated beverages or chocolate 12 hours prior to your test. Do not take any antihistamines 12 hours prior to your test.  On the Day of the Test: Drink plenty of water until 1 hour prior to the test. Do not eat any food 4 hours prior to the test. You may take your regular medications prior to the test.  Take metoprolol (Lopressor) two hours prior to test. FEMALES- please wear underwire-free bra if available, avoid dresses & tight clothing       After the Test: Drink plenty of water. After receiving IV contrast, you may experience a mild flushed feeling. This is normal. On occasion, you may experience a mild rash up to 24 hours after the test. This is not dangerous. If this occurs, you can take Benadryl 25 mg and  increase your fluid intake. If you experience trouble breathing, this can be serious. If it is severe call 911 IMMEDIATELY. If it is mild, please call our office. If you take any of these medications: Glipizide/Metformin, Avandament, Glucavance, please do not take 48 hours after completing test unless otherwise instructed.  We will call to schedule your test 2-4 weeks out understanding that some insurance companies will need an  authorization prior to the service being performed.   For non-scheduling related questions, please contact the cardiac imaging nurse navigator should you have any questions/concerns: Rockwell Alexandria, Cardiac Imaging Nurse Navigator Larey Brick, Cardiac Imaging Nurse Navigator [facility] Direct Office Dial: [phone]   For scheduling needs, including cancellations and rescheduling, please call [place], [phone].

## 2022-07-04 NOTE — Progress Notes (Signed)
Cardiology Office Note:    Date:  07/05/2022   ID:  Vanessa Parks, DOB 11-20-1976, MRN [id number]  PCP:  Patient, No Pcp Per   [facility] Providers Cardiologist:  None     Referring MD: Eber Jones, NP   Chief Complaint  Patient presents with   Chest Pain  Vanessa Parks is a 46 y.o. female who is being seen today for the evaluation of chest pain at the request of Eber Jones, NP.   History of Present Illness:    Vanessa Parks is a 46 y.o. female with a hx of diabetes mellitus (type II, requiring insulin), hypercholesterolemia, hypertension, presenting with complaints of right upper chest discomfort.  The chest discomfort has a squeezing pressure-like quality.  It is not positional and although she may have some tenderness to palpation in that area, palpation does not really reproduce the complaint.  There is no association with physical activity or meals.  It has been going on for about 5 weeks now.  She went to the emergency room on 06/07/2022 when she also had a sensation of rapid heartbeat as well as lower back pain.  Reportedly her pulse oximeter showed a heart rate of 150 bpm.  Her work-up was benign (labs, ECG, chest x-ray).    She had an electrocardiogram performed on June 21 by her primary care provider and another one performed in the emergency room on 06/07/2022, both of which showed normal sinus rhythm with a QS pattern in leads V1-V2, but no acute ischemic changes.  Cardiac enzymes on 06/07/2022, repeated 2 hours apart, were completely normal despite prolonged pain.  She is quite sedentary but denies shortness of breath with usual activity.  She has not had sustained palpitations and has not had syncope.  She denies edema, orthopnea or PND.  She has not had any focal neurological complaints.  She reports gaining 40 pounds in the last year or so.  She has recently started Ozempic and rosuvastatin.  Her most recent hemoglobin A1c was 8.4%.  Her  LDL was 124, HDL 49 (before starting statin).  She does not smoke.  She had a fairly recent hysterectomy with bilateral salpingo-oophorectomy in March 2020.  Although there is a family history of diabetes and hypertension, there is no family history of early onset CAD.  Past Medical History:  Diagnosis Date   BMI 32.0-32.9,adult    Chest pain    Constipation    Essential (primary) hypertension    History of total hysterectomy 08/28/2020   Hyperlipidemia    Other fatigue    Qualitative platelet defects (HCC)    Shortness of breath    Type 2 diabetes mellitus with hyperglycemia (HCC)    Type 2 diabetes mellitus without complications [facility])     Past Surgical History:  Procedure Laterality Date   CESAREAN SECTION     x2   PARTIAL HYSTERECTOMY  01/2019    Current Medications: Current Meds  Medication Sig   LINZESS 290 MCG CAPS capsule Take 290 mcg by mouth daily.   loratadine (CLARITIN) 10 MG tablet Take 20 mg by mouth at bedtime.   metoprolol tartrate (LOPRESSOR) 100 MG tablet Take one tablet two hours prior to the test   olmesartan (BENICAR) 20 MG tablet Take 20 mg by mouth daily.   OZEMPIC, 0.25 OR 0.5 MG/DOSE, 2 MG/3ML SOPN Inject into the skin.   rosuvastatin (CRESTOR) 10 MG tablet Take 10 mg by mouth at bedtime.  Allergies:   Lipitor [atorvastatin] and Victoza [liraglutide]   Social History   Socioeconomic History   Marital status: Divorced    Spouse name: Not on file   Number of children: Not on file   Years of education: Not on file   Highest education level: Not on file  Occupational History   Not on file  Tobacco Use   Smoking status: Never   Smokeless tobacco: Never  Substance and Sexual Activity   Alcohol use: No   Drug use: Not on file   Sexual activity: Not on file  Other Topics Concern   Not on file  Social History Narrative   Not on file   Social Determinants of Health   Financial Resource Strain: Not on file  Food Insecurity: Not on file   Transportation Needs: Not on file  Physical Activity: Not on file  Stress: Not on file  Social Connections: Not on file     Family History: The patient's family history includes Diabetes in her father and mother; Hyperlipidemia in her father and mother; Hypertension in her father and mother.  ROS:   Please see the history of present illness.     All other systems reviewed and are negative.  EKGs/Labs/Other Studies Reviewed:    The following studies were reviewed today: Notes, chest x-ray, ECG and labs from ED visit 06/07/2022 Notes, ECG and labs from PCP  EKG:  EKG is  ordered today.  The ekg ordered today demonstrates normal sinus rhythm, QS pattern in leads V1-V2, otherwise normal tracing.  Recent Labs: 06/07/2022: Hemoglobin 15.0; Platelets 253 07/04/2022: BUN 10; Creatinine, Ser 0.66; Potassium 4.4; Sodium 136  Recent Lipid Panel No results found for: "CHOL", "TRIG", "HDL", "CHOLHDL", "VLDL", "LDLCALC", "LDLDIRECT" 05/29/2022 Cholesterol 207, LDL 124, HDL 49, triglycerides 169 Hemoglobin A1c 8.4%  Risk Assessment/Calculations:           Physical Exam:    VS:  BP 113/77 (BP Location: Left Arm, Patient Position: Sitting, Cuff Size: Large)   Pulse 87   Ht 5\' 8"  (1.727 m)   Wt 210 lb 6.4 oz (95.4 kg)   SpO2 95%   BMI 31.99 kg/m     Wt Readings from Last 3 Encounters:  07/04/22 210 lb 6.4 oz (95.4 kg)  06/07/22 218 lb 4.1 oz (99 kg)  10/12/16 219 lb 1 oz (99.4 kg)     GEN: Mildly obese, well nourished, well developed in no acute distress HEENT: Normal NECK: No JVD; No carotid bruits LYMPHATICS: No lymphadenopathy CARDIAC: RRR, no murmurs, rubs, gallops RESPIRATORY:  Clear to auscultation without rales, wheezing or rhonchi  ABDOMEN: Soft, non-tender, non-distended MUSCULOSKELETAL:  No edema; No deformity  SKIN: Warm and dry NEUROLOGIC:  Alert and oriented x 3 PSYCHIATRIC:  Normal affect   ASSESSMENT:    1. Precordial pain   2. Hypercholesterolemia   3.  Essential (primary) hypertension   4. Type 2 diabetes mellitus with hyperglycemia, without long-term current use of insulin (HCC)    PLAN:    In order of problems listed above:  Chest pain: Her symptoms are atypical and she had low risk ECG and cardiac enzymes despite prolonged symptoms.  Nevertheless she has numerous risk factors including poorly controlled diabetes mellitus, mild hyperlipidemia and surgical menopause (albeit recent).  We will schedule for coronary CT angiogram.  Also give a small supply of muscle relaxants to see if this has impact on her chest pain. HLP: Regardless of the findings on her coronary work-up, I agree with  the recommendation for lipid-lowering therapy.  Simply because she has diabetes, target LDL less than 100.  Reduce target to less than 70 if we see coronary stenosis or a high calcium score. HTN: Controlled on ARB DM: Not well controlled.  Associated with marked weight gain.  Ozempic is a good choice.           Medication Adjustments/Labs and Tests Ordered: Current medicines are reviewed at length with the patient today.  Concerns regarding medicines are outlined above.  Orders Placed This Encounter  Procedures   CT CORONARY MORPH W/CTA COR W/SCORE W/CA W/CM &/OR WO/CM   Basic metabolic panel   EKG 12-Lead   Meds ordered this encounter  Medications   cyclobenzaprine (FLEXERIL) 10 MG tablet    Sig: Take 1 tablet (10 mg total) by mouth 3 (three) times daily as needed for muscle spasms.    Dispense:  20 tablet    Refill:  0   metoprolol tartrate (LOPRESSOR) 100 MG tablet    Sig: Take one tablet two hours prior to the test    Dispense:  1 tablet    Refill:  0    Patient Instructions  Medication Instructions:  No changes  *If you need a refill on your cardiac medications before your next appointment, please call your pharmacy*   Lab Work: Your provider would like for you to have the following labs today: BMET  If you have labs (blood work)  drawn today and your tests are completely normal, you will receive your results only by: MyChart Message (if you have MyChart) OR A paper copy in the mail If you have any lab test that is abnormal or we need to change your treatment, we will call you to review the results.   Follow-Up: At [facility], you and your health needs are our priority.  As part of our continuing mission to provide you with exceptional heart care, we have created designated Provider Care Teams.  These Care Teams include your primary Cardiologist (physician) and Advanced Practice Providers (APPs -  Physician Assistants and Nurse Practitioners) who all work together to provide you with the care you need, when you need it.  We recommend signing up for the patient portal called "MyChart".  Sign up information is provided on this After Visit Summary.  MyChart is used to connect with patients for Virtual Visits (Telemedicine).  Patients are able to view lab/test results, encounter notes, upcoming appointments, etc.  Non-urgent messages can be sent to your provider as well.   To learn more about what you can do with MyChart, go to [email or web address].    Your next appointment:   3 month(s)  The format for your next appointment:   In Person  Provider:   Dr. Royann Shivers  Other Instructions   Your cardiac CT will be scheduled at one of the below locations:   [facility] [address] [place], [place] [address] [phone]  OR  [facility] [address] [place], [place] [address] [phone]  If scheduled at [facility], please arrive at the [facility] (Entrance C2) of [facility] 30 minutes prior to test start time. You can use the FREE valet parking offered at entrance C (encouraged to control the heart rate for the test)  Proceed to the [facility] Radiology Department (first floor) to check-in and test  prep.  All radiology patients and guests should use entrance C2 at [facility], accessed from [facility], even though the hospital's physical address listed is [address]    If scheduled at [facility], please arrive 15 mins early for check-in and test prep.  Please follow these instructions carefully (unless otherwise directed):   On the Night Before the Test: Be sure to Drink plenty of water. Do not consume any caffeinated/decaffeinated beverages or chocolate 12 hours prior to your test. Do not take any antihistamines 12 hours prior to your test.  On the Day of the Test: Drink plenty of water until 1 hour prior to the test. Do not eat any food 4 hours prior to the test. You may take your regular medications prior to the test.  Take metoprolol (Lopressor) two hours prior to test. FEMALES- please wear underwire-free bra if available, avoid dresses & tight clothing       After the Test: Drink plenty of water. After receiving IV contrast, you may experience a mild flushed feeling. This is normal. On occasion, you may experience a mild rash up to 24 hours after the test. This is not dangerous. If this occurs, you can take Benadryl 25 mg and increase your fluid intake. If you experience trouble breathing, this can be serious. If it is severe call 911 IMMEDIATELY. If it is mild, please call our office. If you take any of these medications: Glipizide/Metformin, Avandament, Glucavance, please do not take 48 hours after completing test unless otherwise instructed.  We will call to schedule your test 2-4 weeks out understanding that some insurance companies will need an authorization prior to the service being performed.   For non-scheduling related questions, please contact the cardiac imaging nurse navigator should you have any questions/concerns: Rockwell Alexandria, Cardiac Imaging Nurse Navigator Larey Brick, Cardiac Imaging Nurse  Navigator [place] [facility] Direct Office Dial: [phone]   For scheduling needs, including cancellations and rescheduling, please call [place], [phone].       Signed, Thurmon Fair, MD  07/05/2022 9:14 PM     Heart[facility]

## 2022-07-05 ENCOUNTER — Encounter: Payer: Self-pay | Admitting: Cardiovascular Disease

## 2022-07-05 ENCOUNTER — Telehealth (HOSPITAL_COMMUNITY): Payer: Self-pay | Admitting: *Deleted

## 2022-07-05 LAB — BASIC METABOLIC PANEL
BUN/Creatinine Ratio: 15 (ref 9–23)
BUN: 10 mg/dL (ref 6–24)
CO2: 19 mmol/L — ABNORMAL LOW (ref 20–29)
Calcium: 9.1 mg/dL (ref 8.7–10.2)
Chloride: 100 mmol/L (ref 96–106)
Creatinine, Ser: 0.66 mg/dL (ref 0.57–1.00)
Glucose: 129 mg/dL — ABNORMAL HIGH (ref 70–99)
Potassium: 4.4 mmol/L (ref 3.5–5.2)
Sodium: 136 mmol/L (ref 134–144)
eGFR: 108 mL/min/{1.73_m2} (ref >59)

## 2022-07-05 NOTE — Telephone Encounter (Signed)
Reaching out to patient to offer assistance regarding upcoming cardiac imaging study; pt verbalizes understanding of appt date/time, parking situation and where to check in, pre-test NPO status and medications ordered, and verified current allergies; name and call back number provided for further questions should they arise  Larey Brick RN Navigator Cardiac Imaging [facility] [phone] office [phone] cell  Patient to hold her BP medication and take 100mg  metoprolol tartrate two hours prior to her cardiac CT scan. She is aware to arrive at 1pm.

## 2022-07-08 ENCOUNTER — Ambulatory Visit (HOSPITAL_COMMUNITY)
Admission: RE | Admit: 2022-07-08 | Discharge: 2022-07-08 | Disposition: A | Discharge status: Home / Self Care | Payer: Medicaid Other | Source: Ambulatory Visit | Attending: Cardiovascular Disease | Admitting: Cardiovascular Disease

## 2022-07-08 DIAGNOSIS — R072 Precordial pain: Secondary | ICD-10-CM | POA: Insufficient documentation

## 2022-07-08 MED ORDER — METOPROLOL TARTRATE 5 MG/5ML IV SOLN
INTRAVENOUS | Status: AC
  Filled 2022-07-08: qty 10

## 2022-07-08 MED ORDER — NITROGLYCERIN 0.4 MG SL SUBL
SUBLINGUAL_TABLET | SUBLINGUAL | Status: AC
  Filled 2022-07-08: qty 2

## 2022-07-08 MED ORDER — DILTIAZEM HCL 25 MG/5ML IV SOLN: 10.0000 mg | INTRAVENOUS | Status: DC | PRN

## 2022-07-08 MED ORDER — METOPROLOL TARTRATE 5 MG/5ML IV SOLN
10.0000 mg | INTRAVENOUS | Status: DC | PRN
  Administered 2022-07-08: 10 mg via INTRAVENOUS

## 2022-07-08 MED ORDER — NITROGLYCERIN 0.4 MG SL SUBL
0.8000 mg | SUBLINGUAL_TABLET | Freq: Once | SUBLINGUAL | Status: AC
  Administered 2022-07-08: 0.8 mg via SUBLINGUAL

## 2022-07-08 MED ORDER — IOHEXOL 350 MG/ML SOLN
100.0000 mL | Freq: Once | INTRAVENOUS | Status: AC | PRN
  Administered 2022-07-08: 100 mL via INTRAVENOUS

## 2022-07-10 ENCOUNTER — Ambulatory Visit: Payer: Medicaid Other | Admitting: Cardiology

## 2022-07-23 ENCOUNTER — Other Ambulatory Visit (HOSPITAL_COMMUNITY): Payer: Medicaid Other

## 2022-08-20 ENCOUNTER — Telehealth: Payer: Self-pay | Admitting: Cardiovascular Disease

## 2022-08-20 NOTE — Telephone Encounter (Signed)
I think is quite unlikely that her chest pain is cardiac in etiology.  Previous ECG performed in the emergency room during chest pain was normal.  Had a normal coronary CT angiogram and a calcium score of 0 last month.  Cardiac pain rarely is continuous for 36 hours unless it is a clear-cut heart attack.  Cardiac pain is usually associated with physical activity and relieved by rest.  Also, did not see any other cardiovascular pathology that could explain chest pain on her CT (normal caliber aorta without aneurysm or dissection).  I think that GI etiology for her pain is quite possible and would recommend evaluation by gastroenterologist.  I understand that the symptoms began before she started Ozempic, but Ozempic can definitely exacerbate gallbladder related symptoms.  These often radiate to the right chest and right shoulder.

## 2022-08-20 NOTE — Telephone Encounter (Signed)
[name] states she has had CP since June and has seen by Dr.Croitoru.  In speaking with her pcp, Dr.York, patient mentioned sending her to see GI for reflux but patient says pcp told her they will probably ask her to come off Ozempic.  Patient describes sensation of ripping pain in her chest which occurred 2 days ago and lasted for 36 hours. She reports this sensation occurs every couple days and started before she began ozempic.   Currently w/o pain.    I will forward to Dr.Croitoru for advice.

## 2022-08-20 NOTE — Telephone Encounter (Signed)
  Per MyChart scheduling message:   Initial complaint: SVT, possible POTS, continued chest pain   Pt c/o of Chest Pain: STAT if CP now or developed within 24 hours  1. Are you having CP right now?   2. Are you experiencing any other symptoms (ex. SOB, nausea, vomiting, sweating)?   3. How long have you been experiencing CP?   4. Is your CP continuous or coming and going?   5. Have you taken Nitroglycerin?   1. Yes chest pains aren't as bad today as yesterday  2: Yes [place], sweating and sob 3.  Experiencing on/off since May 2023 but sometimes gets intense like yesterday  4. CP stays continuous for a couple of days 5. No on nitroglycerin  ?

## 2022-08-20 NOTE — Telephone Encounter (Signed)
I spoke with patient and she is going to speak with her pcp about referral to GI.

## 2022-09-02 ENCOUNTER — Telehealth: Payer: Self-pay | Admitting: Cardiovascular Disease

## 2022-09-02 NOTE — Telephone Encounter (Signed)
Never received the monitor records. No objection to transferring care to [facility] office.

## 2022-09-02 NOTE — Telephone Encounter (Signed)
Called patient, I did not see monitor results in the system. Patient states her PCP competed the heart monitor and Dr.C was going to request the records to have them be reviewed by him. I advised I would check with primary RN when she returned to see if she has received these.   Patient also states she would like to see a provider in [place] this is closer to her- I advised we would need to do a provider switch, patient made aware, verbalized understanding.  Thanks!

## 2022-09-02 NOTE — Telephone Encounter (Signed)
Pt would like a callback regarding heart monitor results. Please advise

## 2022-09-04 ENCOUNTER — Telehealth: Payer: Self-pay | Admitting: Cardiovascular Disease

## 2022-09-04 NOTE — Telephone Encounter (Signed)
Appointment changed to 11/14.

## 2022-09-04 NOTE — Telephone Encounter (Signed)
Patient called in to triage to ask about changing her 10/10 appointment to a later date. She also stated she will bring a hard copy of monitor results. She wants to know if she has been diagnosed with POTS.

## 2022-09-10 NOTE — Telephone Encounter (Signed)
Patient wants to switch to [facility] office, do one of you all agree to see her?

## 2022-09-17 ENCOUNTER — Ambulatory Visit: Payer: Medicaid Other | Admitting: Cardiovascular Disease

## 2022-10-22 ENCOUNTER — Ambulatory Visit: Payer: Medicaid Other | Admitting: Cardiovascular Disease

## 2022-10-24 ENCOUNTER — Encounter: Payer: Self-pay | Admitting: Cardiology

## 2022-10-24 ENCOUNTER — Ambulatory Visit: Payer: Medicaid Other | Attending: Cardiovascular Disease | Admitting: Cardiology

## 2022-10-24 VITALS — BP 110/78 | HR 101 | Ht 68.0 in | Wt 213.2 lb

## 2022-10-24 DIAGNOSIS — R072 Precordial pain: Secondary | ICD-10-CM

## 2022-10-24 DIAGNOSIS — E782 Mixed hyperlipidemia: Secondary | ICD-10-CM

## 2022-10-24 DIAGNOSIS — I10 Essential (primary) hypertension: Secondary | ICD-10-CM

## 2022-10-24 DIAGNOSIS — R0602 Shortness of breath: Secondary | ICD-10-CM

## 2022-10-24 DIAGNOSIS — E119 Type 2 diabetes mellitus without complications: Secondary | ICD-10-CM

## 2022-10-24 NOTE — Progress Notes (Signed)
Cardiology Office Note:    Date:  10/24/2022   ID:  Vanessa Parks, DOB [birth year], MRN [id number]  PCP:  Patient, No Pcp Per  Cardiologist:  Gypsy Balsam, MD    Referring MD: No ref. provider found   Chief Complaint  Patient presents with   Chest Pain   Shortness of Breath        svt    History of Present Illness:    Vanessa Parks is a 46 y.o. female with past medical history significant for essential hypertension, dyslipidemia, diabetes, she was referred to [place] originally for chest pain.  What prompted the referral was visiting the emergency room.  In the emergency room her EKG did not show any acute changes except Q waves in V1 V2, cardiac enzymes were -2 hours apart, after that she had coronary CT angio performed which showed calcium score 0, normal coronaries without any coronary artery disease.  She been doing quite well but still started having some chest pain she was referred back to [place].  In the meantime she did see GI specialist and she is actually scheduled to have a HIDA scan which is negative GI.  She tells me she still have some chest pain.  That can happen anytime she can be sitting she will get it that pain can last a couple hours.  Tums does not help with this sensation.  She cannot find any relieving or aggravating factors, there is no shortness of breath there is no sweating associated with this sensation.  She she does not exercise on the regular basis she used to do elliptical and while she was doing elliptical she had no problem last time she did elliptical was in May however she sustained some Achilles injury and since that time she is not doing any exercises.  Past Medical History:  Diagnosis Date   BMI 32.0-32.9,adult    Chest pain    Constipation    Essential (primary) hypertension    History of total hysterectomy 08/28/2020   Hyperlipidemia    Other fatigue    Qualitative platelet defects (HCC)    Shortness of breath    Type 2 diabetes mellitus  with hyperglycemia (HCC)    Type 2 diabetes mellitus without complications (HCC)     Past Surgical History:  Procedure Laterality Date   CESAREAN SECTION     x2   PARTIAL HYSTERECTOMY  01/2019    Current Medications: Current Meds  Medication Sig   cyclobenzaprine (FLEXERIL) 10 MG tablet Take 1 tablet (10 mg total) by mouth 3 (three) times daily as needed for muscle spasms.   ibuprofen (ADVIL) 600 MG tablet Take 1 tablet (600 mg total) by mouth every 6 (six) hours as needed. (Patient taking differently: Take 600 mg by mouth every 6 (six) hours as needed for mild pain or moderate pain.)   LINZESS 290 MCG CAPS capsule Take 290 mcg by mouth daily.   loratadine (CLARITIN) 10 MG tablet Take 20 mg by mouth at bedtime.   olmesartan (BENICAR) 20 MG tablet Take 20 mg by mouth daily.   rosuvastatin (CRESTOR) 10 MG tablet Take 10 mg by mouth at bedtime.   tirzepatide [facility]) 5 MG/0.5ML Pen Inject 5 mg into the skin once a week.   [DISCONTINUED] metoprolol tartrate (LOPRESSOR) 100 MG tablet Take one tablet two hours prior to the test (Patient taking differently: Take 100 mg by mouth 2 (two) times daily. Take one tablet two hours prior to the test)   [DISCONTINUED]  OZEMPIC, 0.25 OR 0.5 MG/DOSE, 2 MG/3ML SOPN Inject into the skin.     Allergies:   Lipitor [atorvastatin] and Victoza [liraglutide]   Social History   Socioeconomic History   Marital status: Divorced    Spouse name: Not on file   Number of children: Not on file   Years of education: Not on file   Highest education level: Not on file  Occupational History   Not on file  Tobacco Use   Smoking status: Never   Smokeless tobacco: Never  Substance and Sexual Activity   Alcohol use: No   Drug use: Not on file   Sexual activity: Not on file  Other Topics Concern   Not on file  Social History Narrative   Not on file   Social Determinants of Health   Financial Resource Strain: Not on file  Food Insecurity: Not on file   Transportation Needs: Not on file  Physical Activity: Not on file  Stress: Not on file  Social Connections: Not on file     Family History: The patient's family history includes Diabetes in her father and mother; Hyperlipidemia in her father and mother; Hypertension in her father and mother. ROS:   Please see the history of present illness.    All 14 point review of systems negative except as described per history of present illness  EKGs/Labs/Other Studies Reviewed:      Recent Labs: 06/07/2022: Hemoglobin 15.0; Platelets 253 07/04/2022: BUN 10; Creatinine, Ser 0.66; Potassium 4.4; Sodium 136  Recent Lipid Panel No results found for: "CHOL", "TRIG", "HDL", "CHOLHDL", "VLDL", "LDLCALC", "LDLDIRECT"  Physical Exam:    VS:  BP 110/78 (BP Location: Right Arm)   Pulse (!) 101   Ht 5\' 8"  (1.727 m)   Wt 213 lb 3.2 oz (96.7 kg)   SpO2 96%   BMI 32.42 kg/m     Wt Readings from Last 3 Encounters:  10/24/22 213 lb 3.2 oz (96.7 kg)  07/04/22 210 lb 6.4 oz (95.4 kg)  06/07/22 218 lb 4.1 oz (99 kg)     GEN:  Well nourished, well developed in no acute distress HEENT: Normal NECK: No JVD; No carotid bruits LYMPHATICS: No lymphadenopathy CARDIAC: RRR, no murmurs, no rubs, no gallops RESPIRATORY:  Clear to auscultation without rales, wheezing or rhonchi  ABDOMEN: Soft, non-tender, non-distended MUSCULOSKELETAL:  No edema; No deformity  SKIN: Warm and dry LOWER EXTREMITIES: no swelling NEUROLOGIC:  Alert and oriented x 3 PSYCHIATRIC:  Normal affect   ASSESSMENT:    1. Precordial pain   2. Shortness of breath   3. Mixed hyperlipidemia   4. Type 2 diabetes mellitus without complication, unspecified whether long term insulin use (HCC)   5. Essential (primary) hypertension    PLAN:    In order of problems listed above:  Precordial pain.  Physical exam revealed tenderness in the right upper quadrant, I suspect gallbladder is an issue.  She is scheduled for a HIDA scan she is  being followed by GI which I think it is an excellent idea.  Pain does not have any cardiac characteristic on top of that coronary CT angio done just few months ago show normal coronaries without any coronary artery disease. Dyspnea on exertion: I will ask her to have echocardiogram done make sure she does not have any diminished left ventricle ejection fraction even though I have very low level suspicion for problem. Mixed dyslipidemia she is taking Crestor 10, it is a moderate intense statin which is appropriate  medication as recommended by guidelines for somebody with diabetes.  I do have fasting lipid profile done few months ago which show LDL of 124 HDL 49.  I am not sure if that was done on medication with direct her target LDL should be less than 100. GI issues which I suspect is the leading problem here.  I will see him back in my office in 6 months and see what will be the results of this investigation from my point reviewed the key will be risk factors modifications we did talk about need to exercise on the regular basis she takes over the antiplatelets therapy for aspirin, statin is being given her blood pressure seems to be controlled diabetes is getting better.   Medication Adjustments/Labs and Tests Ordered: Current medicines are reviewed at length with the patient today.  Concerns regarding medicines are outlined above.  No orders of the defined types were placed in this encounter.  Medication changes: No orders of the defined types were placed in this encounter.   Signed, Georgeanna Lea, MD, [facility] 10/24/2022 2:04 PM    [facility]

## 2022-10-24 NOTE — Patient Instructions (Addendum)

## 2022-10-25 NOTE — Addendum Note (Signed)
Addended by: Roddie Mc on: 10/25/2022 08:33 AM   Modules accepted: Orders

## 2022-11-08 ENCOUNTER — Ambulatory Visit: Payer: Medicaid Other | Attending: Cardiology

## 2022-11-08 DIAGNOSIS — R0602 Shortness of breath: Secondary | ICD-10-CM | POA: Diagnosis not present

## 2022-11-08 LAB — ECHOCARDIOGRAM COMPLETE
Area-P 1/2: 3.68 cm2
S' Lateral: 2.4 cm

## 2022-12-03 DIAGNOSIS — R1011 Right upper quadrant pain: Secondary | ICD-10-CM | POA: Insufficient documentation

## 2022-12-09 DIAGNOSIS — C159 Malignant neoplasm of esophagus, unspecified: Secondary | ICD-10-CM

## 2022-12-09 HISTORY — DX: Malignant neoplasm of esophagus, unspecified: C15.9

## 2023-03-15 ENCOUNTER — Emergency Department (HOSPITAL_COMMUNITY): Payer: Medicaid Other

## 2023-03-15 ENCOUNTER — Other Ambulatory Visit: Payer: Self-pay

## 2023-03-15 ENCOUNTER — Emergency Department (HOSPITAL_COMMUNITY)
Admission: EM | Admit: 2023-03-15 | Discharge: 2023-03-16 | Disposition: A | Discharge status: Home / Self Care | Payer: Medicaid Other | Attending: Emergency Medicine | Admitting: Emergency Medicine

## 2023-03-15 DIAGNOSIS — Z79899 Other long term (current) drug therapy: Secondary | ICD-10-CM | POA: Insufficient documentation

## 2023-03-15 DIAGNOSIS — E119 Type 2 diabetes mellitus without complications: Secondary | ICD-10-CM | POA: Insufficient documentation

## 2023-03-15 DIAGNOSIS — Z8501 Personal history of malignant neoplasm of esophagus: Secondary | ICD-10-CM | POA: Diagnosis not present

## 2023-03-15 DIAGNOSIS — R1084 Generalized abdominal pain: Secondary | ICD-10-CM | POA: Insufficient documentation

## 2023-03-15 DIAGNOSIS — R079 Chest pain, unspecified: Secondary | ICD-10-CM | POA: Insufficient documentation

## 2023-03-15 DIAGNOSIS — Z7982 Long term (current) use of aspirin: Secondary | ICD-10-CM | POA: Insufficient documentation

## 2023-03-15 DIAGNOSIS — I10 Essential (primary) hypertension: Secondary | ICD-10-CM | POA: Insufficient documentation

## 2023-03-15 LAB — CBC
HCT: 38.6 % (ref 36.0–46.0)
Hemoglobin: 12.6 g/dL (ref 12.0–15.0)
MCH: 28.7 pg (ref 26.0–34.0)
MCHC: 32.6 g/dL (ref 30.0–36.0)
MCV: 87.9 fL (ref 80.0–100.0)
Platelets: 197 10*3/uL (ref 150–400)
RBC: 4.39 MIL/uL (ref 3.87–5.11)
RDW: 12.2 % (ref 11.5–15.5)
WBC: 9.5 10*3/uL (ref 4.0–10.5)
nRBC: 0 % (ref 0.0–0.2)

## 2023-03-15 LAB — BASIC METABOLIC PANEL
Anion gap: 10 (ref 5–15)
BUN: 9 mg/dL (ref 6–20)
CO2: 25 mmol/L (ref 22–32)
Calcium: 8.6 mg/dL — ABNORMAL LOW (ref 8.9–10.3)
Chloride: 101 mmol/L (ref 98–111)
Creatinine, Ser: 0.74 mg/dL (ref 0.44–1.00)
GFR, Estimated: >60 mL/min (ref >60)
Glucose, Bld: 298 mg/dL — ABNORMAL HIGH (ref 70–99)
Potassium: 4.1 mmol/L (ref 3.5–5.1)
Sodium: 136 mmol/L (ref 135–145)

## 2023-03-15 LAB — TROPONIN I (HIGH SENSITIVITY): Troponin I (High Sensitivity): 3 ng/L (ref <18)

## 2023-03-15 NOTE — ED Triage Notes (Signed)
Pt to ED by POV from home with c/o substernal CP and epigastric pain which began Thursday following a procedure at [facility] for Esophageal cancer. Denies any NVD.Arrives A+O, VSS, NADN.

## 2023-03-16 ENCOUNTER — Emergency Department (HOSPITAL_COMMUNITY): Payer: Medicaid Other

## 2023-03-16 ENCOUNTER — Encounter (HOSPITAL_COMMUNITY): Payer: Self-pay

## 2023-03-16 LAB — HEPATIC FUNCTION PANEL
ALT: 54 U/L — ABNORMAL HIGH (ref 0–44)
AST: 25 U/L (ref 15–41)
Albumin: 3.5 g/dL (ref 3.5–5.0)
Alkaline Phosphatase: 51 U/L (ref 38–126)
Bilirubin, Direct: <0.1 mg/dL (ref 0.0–0.2)
Total Bilirubin: 0.4 mg/dL (ref 0.3–1.2)
Total Protein: 6.5 g/dL (ref 6.5–8.1)

## 2023-03-16 LAB — TROPONIN I (HIGH SENSITIVITY): Troponin I (High Sensitivity): <2 ng/L (ref <18)

## 2023-03-16 LAB — LIPASE, BLOOD: Lipase: 30 U/L (ref 11–51)

## 2023-03-16 MED ORDER — SUCRALFATE 1 G PO TABS: 1.0000 g | ORAL_TABLET | Freq: Three times a day (TID) | ORAL | 0 refills | Status: DC | PRN

## 2023-03-16 MED ORDER — OXYCODONE HCL 5 MG PO TABS: 5.0000 mg | ORAL_TABLET | ORAL | 0 refills | Status: DC | PRN

## 2023-03-16 MED ORDER — ALUM & MAG HYDROXIDE-SIMETH 200-200-20 MG/5ML PO SUSP
30.0000 mL | Freq: Once | ORAL | Status: AC
  Administered 2023-03-16: 30 mL via ORAL
  Filled 2023-03-16: qty 30

## 2023-03-16 MED ORDER — MORPHINE SULFATE (PF) 4 MG/ML IV SOLN
4.0000 mg | Freq: Once | INTRAVENOUS | Status: AC
  Administered 2023-03-16: 4 mg via INTRAVENOUS
  Filled 2023-03-16: qty 1

## 2023-03-16 MED ORDER — KETOROLAC TROMETHAMINE 15 MG/ML IJ SOLN
15.0000 mg | Freq: Once | INTRAMUSCULAR | Status: AC
  Administered 2023-03-16: 15 mg via INTRAVENOUS
  Filled 2023-03-16: qty 1

## 2023-03-16 MED ORDER — CYCLOBENZAPRINE HCL 10 MG PO TABS: 10.0000 mg | ORAL_TABLET | Freq: Two times a day (BID) | ORAL | 0 refills | Status: DC | PRN

## 2023-03-16 MED ORDER — IOHEXOL 350 MG/ML SOLN
75.0000 mL | Freq: Once | INTRAVENOUS | Status: AC | PRN
  Administered 2023-03-16: 75 mL via INTRAVENOUS

## 2023-03-16 MED ORDER — IOHEXOL 300 MG/ML  SOLN
100.0000 mL | Freq: Once | INTRAMUSCULAR | Status: AC | PRN
  Administered 2023-03-16: 100 mL via ORAL

## 2023-03-16 MED ORDER — SODIUM CHLORIDE 0.9 % IV BOLUS
1000.0000 mL | Freq: Once | INTRAVENOUS | Status: AC
  Administered 2023-03-16: 1000 mL via INTRAVENOUS

## 2023-03-16 MED ORDER — LIDOCAINE VISCOUS HCL 2 % MT SOLN
15.0000 mL | Freq: Once | OROMUCOSAL | Status: AC
  Administered 2023-03-16: 15 mL via ORAL
  Filled 2023-03-16: qty 15

## 2023-03-16 NOTE — Discharge Instructions (Addendum)
You were seen in the ER today for evaluation of your chest pain.  After speaking with GI over at [facility], they think this is likely typical pain related to the procedure.  I did prescribe you a new medication called Carafate.  You can take this with meals as needed.  He recommends that you continue taking your omeprazole twice daily.  Your surgeon should call you with the results of your biopsy when they result.  At your request, I have prescribed you a few narcotic pain medications.  Please do not drive or operate heavy machinery's while on this medication to make you sleepy.  These could also irritate your stomach given that the bottom of your esophagus is irritated.  Please take this in mind before taking them.  If you have any concerns, new or worsening symptoms, please return to the nearest emergency department for evaluation.  Contact a health care provider if you: Have a sore throat that lasts longer than 1 day. Have a fever. Get help right away if you: Vomit blood or your vomit looks like coffee grounds. Have bloody, black, or tarry stools. Have a very bad sore throat or you cannot swallow. Have difficulty breathing or very bad pain in your chest or abdomen. These symptoms may be an emergency. Get help right away. Call 911. Do not wait to see if the symptoms will go away. Do not drive yourself to the hospital.

## 2023-03-16 NOTE — ED Provider Notes (Signed)
Patient not in room.   Roxy Horseman, PA-C 03/16/23 0131    Sabas Sous, MD 03/17/23 0700

## 2023-03-16 NOTE — ED Provider Notes (Addendum)
Physical Exam  BP 138/81   Pulse 81   Temp 98.5 F (36.9 C)   Resp 15   LMP 09/21/2016 Comment: tubal ligation  SpO2 99%   Physical Exam Vitals and nursing note reviewed.  Constitutional:      Appearance: Normal appearance.  Eyes:     General: No scleral icterus.    Comments: Small tissue growth on the mucosal aspect of the right lower lid. Slightly erythematous. No lesions or blisters seen. No swelling. No pustule.    Pulmonary:     Effort: Pulmonary effort is normal. No respiratory distress.  Skin:    General: Skin is dry.     Findings: No rash.  Neurological:     General: No focal deficit present.     Mental Status: She is alert. Mental status is at baseline.  Psychiatric:        Mood and Affect: Mood normal.     Procedures  Procedures  ED Course / MDM   Clinical Course as of 03/16/23 1247  Sun Mar 16, 2023  0315 Aside from [facility], work up reassuring. Troponin negative, though symptoms less concerning for ACS. Plan for CTA to further assess pain. Risk for Boerhaave's given recent esophageal dissection, though also likely carries risk of hypercoagulation given diagnosis of malignancy.  By my interpretation, chest x-ray without evidence of pneumothorax, pneumonia, pleural effusion, cardiomegaly. [KH]  [phone] Consult placed to [facility] to discuss imaging results.  Will attempt to power share images in the interim pending callback. [KH]  4132 Patient reporting 7/10 pain. Will attempt pain control with GI cocktail, Toradol. [KH]  4401 Spoke with Herbert Deaner, fellow with [facility]. She is going to discuss this gas with her attending and will give me a call back with recommendations. [RR]  0803 On the phone with [facility], Dr. Abbey Chatters. Post-inflammatory changes likely post-procedure. Barium swallow study. If normal, can follow up. If abnormal, call back.  [RR]  [phone] Discussed with patient at bedside this is a conversation I had with the [facility] attending.  She would like to move  forward with a barium swallow study.  Confirmed with x-ray that this can get done today. [RR]  1220 Spoke with Dr. Clotilde Dieter given the barium study results. He recommends continue with the acid supression medication. He is also ok with carafate as well. He reports that the surgeon should call her back with her biopsy results in a few days, but they are not back yet. Additionally, he mentions that the CT findings are likely to be expected given her procedure but out of an abundance of caution wanted to do the barium study. [RR]    Clinical Course User Index [KH] Antony Madura, PA-C [RR] Achille Rich, PA-C   Medical Decision Making Amount and/or Complexity of Data Reviewed Labs: ordered. Radiology: ordered.  Risk OTC drugs. Prescription drug management.   Accepted handoff at shift change from [facility] [place]. Please see prior provider note for more detail.   Briefly: Patient is 47 y.o. female with history of esophageal carcinoma status post EGD on 03-13-2023.  Now having lower esophagus/chest pn.  DDX: concern for possible esophageal ulcer  Plan: Await [facility] consult callback for recommendations.  On reevaluation, patient appears in no acute distress but is asking for pain medication.  Morphine ordered.  Awaiting barium study.  Barium study was unremarkable. Did call [facility] again. Please see above mentioned discussions. Will prescribe the patient some Carafate as well. We  discussed the imaging results at bedside. The patient is requesting narcotic pain medication, however does not appear to be in any acute distress. Will order a few, but warned that this may continue to irritate her stomach. She then requests a muscle relaxer because this helped her that last time she ahd chest pain. I discussed with her that her chest pain isn't MSK related and that it is from her procedure. She still request. Will send in a few flexeril. Recommended against spicy and acidic foods.   Additionally, patient  wanted me to look at a growth on the inside of her bottom lid on the right. She reports she noticed it a few days ago when putting on eyeliner. It's not painful or itching, nor is her vision disturbed. Ophthalmology info given. Appears to be more of a tissue growth on the mucosal aspect of the right lower lid. No lesions or blisters seen. No swelling. No pustule.   We discussed plan at bedside. We discussed strict return precautions and red flag symptoms. The patient verbalized their understanding and agrees to the plan. The patient is stable and being discharged home in good condition.  Portions of this report may have been transcribed using voice recognition software. Every effort was made to ensure accuracy; however, inadvertent computerized transcription errors may be present.     Achille Rich, PA-C 03/16/23 1253    Achille Rich, PA-C 03/16/23 1303    Elayne Snare K, DO 03/16/23 1551

## 2023-03-16 NOTE — ED Notes (Signed)
AVS reviewed with pt prior to discharge. Pt verbalizes understanding. Belongings with pt upon depart. Ambulatory to POV with daughter.

## 2023-03-16 NOTE — ED Provider Notes (Signed)
[place] EMERGENCY DEPARTMENT AT [facility] Provider Note   CSN: [id number] Arrival date & time: 03/15/23  2231     History  Chief Complaint  Patient presents with   Chest Pain    Vanessa Parks is a 47 y.o. female.  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  She describes the pain as feeling as though her chest wants to "rip open".  Her symptoms began Thursday evening after her dissection procedure.  They have remained constant and pain is unrelieved with Tylenol.  Notes exacerbation with deep breathing as well as eating.  She has not had any fevers, nausea, vomiting, hemoptysis, bowel changes, abdominal distention.  Patient does feel as though the pain radiates to her epigastrium.  Called her GI physician to discuss these symptoms and was told to present to her closes ER for evaluation.  [facility] - Dr. Lurena Joiner [facility] procedure on 03/13/23 done by Dr. Orland Mustard  The history is provided by the patient. No language interpreter was used.  Chest Pain      Home Medications Prior to Admission medications   Medication Sig Start Date End Date Taking? Authorizing Provider  aspirin 325 MG tablet Take 325 mg by mouth every 6 (six) hours as needed for moderate pain.   Yes [provider]  calcium carbonate (TUMS - DOSED IN MG ELEMENTAL CALCIUM) 500 MG chewable tablet Chew 1 tablet by mouth daily as needed for indigestion or heartburn.   Yes [provider]  carvedilol (COREG) 6.25 MG tablet Take 6.25 mg by mouth 2 (two) times daily with a meal.   Yes [provider]  cyclobenzaprine (FLEXERIL) 10 MG tablet Take 1 tablet (10 mg total) by mouth 3 (three) times daily as needed for muscle spasms. 07/04/22  Yes Croitoru, Mihai, MD  dicyclomine (BENTYL) 10 MG capsule Take 10 mg by  mouth daily.   Yes [provider]  diphenhydramine-acetaminophen (TYLENOL PM) 25-500 MG TABS tablet Take 1 tablet by mouth at bedtime as needed (pain).   Yes [provider]  LINZESS 290 MCG CAPS capsule Take 290 mcg by mouth daily. 07/02/22  Yes [provider]  loratadine (CLARITIN) 10 MG tablet Take 20 mg by mouth at bedtime. 06/05/22  Yes [provider]  meloxicam (MOBIC) 15 MG tablet Take 15 mg by mouth daily as needed for pain.   Yes [provider]  olmesartan (BENICAR) 20 MG tablet Take 20 mg by mouth daily. 05/29/22  Yes [provider]  omeprazole (PRILOSEC) 40 MG capsule Take 40 mg by mouth in the morning and at bedtime.   Yes [provider]  ondansetron (ZOFRAN) 4 MG tablet Take 4 mg by mouth every 8 (eight) hours as needed for nausea or vomiting.   Yes [provider]  rosuvastatin (CRESTOR) 10 MG tablet Take 10 mg by mouth in the morning. 05/30/22  Yes [provider]  sulindac (CLINORIL) 150 MG tablet Take 150 mg by mouth 2 (two) times daily as needed (pain).   Yes [provider]  tirzepatide Greggory Keen) 10 MG/0.5ML Pen Inject 10 mg into the skin once a week.   Yes [provider]  ibuprofen (ADVIL) 600 MG tablet Take 1 tablet (600 mg total) by mouth every 6 (six) hours as needed. Patient not taking: Reported on 03/16/2023 06/07/22   Linwood Dibbles, MD  Allergies    Lipitor [atorvastatin], Sulfa antibiotics, and Victoza [liraglutide]    Review of Systems   Review of Systems  Cardiovascular:  Positive for chest pain.  Ten systems reviewed and are negative for acute change, except as noted in the HPI.    Physical Exam Updated Vital Signs BP 138/81   Pulse 81   Temp 98.5 F (36.9 C)   Resp 15   LMP 09/21/2016 Comment: tubal ligation  SpO2 99%   Physical Exam Vitals and nursing note reviewed.  Constitutional:      General: She is not in acute distress.    Appearance: She is  well-developed. She is not diaphoretic.     Comments: Pleasant, nontoxic appearing female.  HENT:     Head: Normocephalic and atraumatic.     Mouth/Throat:     Comments: Normal phonation.  Tolerating secretions without difficulty. Eyes:     General: No scleral icterus.    Conjunctiva/sclera: Conjunctivae normal.  Cardiovascular:     Rate and Rhythm: Normal rate and regular rhythm.     Pulses: Normal pulses.  Pulmonary:     Effort: Pulmonary effort is normal. No respiratory distress.     Breath sounds: No stridor. No wheezing or rales.  Abdominal:     Palpations: Abdomen is soft. There is no mass.     Tenderness: There is no guarding.     Comments: Soft, nondistended abdomen. Generalized, mild TTP on exam without peritoneal signs.  Musculoskeletal:        General: Normal range of motion.     Cervical back: Normal range of motion.  Skin:    General: Skin is warm and dry.     Coloration: Skin is not pale.     Findings: No erythema or rash.  Neurological:     Mental Status: She is alert and oriented to person, place, and time.  Psychiatric:        Behavior: Behavior normal.     ED Results / Procedures / Treatments   Labs (all labs ordered are listed, but only abnormal results are displayed) Labs Reviewed  BASIC METABOLIC PANEL - Abnormal; Notable for the following components:      Result Value   Glucose, Bld 298 (*)    Calcium 8.6 (*)    All other components within normal limits  HEPATIC FUNCTION PANEL - Abnormal; Notable for the following components:   ALT 54 (*)    All other components within normal limits  CBC  LIPASE, BLOOD  TROPONIN I (HIGH SENSITIVITY)  TROPONIN I (HIGH SENSITIVITY)    EKG None  Radiology CT Angio Chest PE W and/or Wo Contrast  Result Date: 03/16/2023 CLINICAL DATA:  Abdominal pain. Prior history of esophageal cancer. Chest pain. EXAM: CT ANGIOGRAPHY CHEST CT ABDOMEN AND PELVIS WITH CONTRAST TECHNIQUE: Multidetector CT imaging of the chest  was performed using the standard protocol during bolus administration of intravenous contrast. Multiplanar CT image reconstructions and MIPs were obtained to evaluate the vascular anatomy. Multidetector CT imaging of the abdomen and pelvis was performed using the standard protocol during bolus administration of intravenous contrast. RADIATION DOSE REDUCTION: This exam was performed according to the departmental dose-optimization program which includes automated exposure control, adjustment of the mA and/or kV according to patient size and/or use of iterative reconstruction technique. CONTRAST:  72mL OMNIPAQUE IOHEXOL 350 MG/ML SOLN COMPARISON:  PA Lat chest today, PA Lat chest 06/07/2022, partial chest CT for CTA coronary arteries 07/08/2022, and CT abdomen and pelvis with  IV contrast 04/11/2020. FINDINGS: CTA CHEST FINDINGS Cardiovascular: The cardiac size is normal. There is no significant pericardial fluid. There is no visible coronary artery calcification. The pulmonary arteries are normal caliber without evidence of embolic disease. The pulmonary veins are decompressed. Minimal calcific plaques noted in the aorta in the distal arch and descending segment. There is no aneurysm, stenosis or dissection. The great vessels are clear. There is a normal variant brachiobicarotid trunk. Mediastinum/Nodes: No enlarged mediastinal, hilar, or axillary lymph nodes. Thyroid gland and trachea demonstrate no significant findings. There is mild-to-moderate increased wall prominence at the EG junction with hazy edema alongside the most distal esophagus. Correlate with endoscopic results if not already done. There is no pneumomediastinum or mediastinal fluid collections. Lungs/Pleura: There is mild elevation right hemidiaphragm probably due to eventration. Bronchial thickening noted in the lower lobes, with mosaic attenuation compatible with air trapping and small airways disease. No bronchiectasis or bronchial plugging is seen.  No focal pneumonia is evident. Chronic linear scar-like opacity again noted lateral left lower lobe base and anterior right middle lobe. No nodules or active infiltrates are seen. Musculoskeletal: No chest wall abnormality. No acute or significant osseous findings. Review of the MIP images confirms the above findings. CT ABDOMEN and PELVIS FINDINGS Hepatobiliary: The liver is 23.5 cm length, mildly steatotic without mass enhancement. The gallbladder and bile ducts are unremarkable. Pancreas: No abnormality Spleen: No abnormality.  No splenomegaly. Adrenals/Urinary Tract: Adrenal glands are unremarkable. Kidneys are normal, without renal calculi, focal lesion, or hydronephrosis. Bladder is unremarkable. Stomach/Bowel: As above there is mild-to-moderate prominence of the wall of the EG junction. Edema surrounds the EG junction continuing into the abdomen just below the hiatus, where there are clustered subcentimeter celiac lymph nodes in the area. There is trace air in the folds along the gastric side of the EG junction, which could indicate air within an EG junction ulcer or within a mucosal tear, or could simply be within a thickened fold. The adjacent edema is likely inflammatory or infectious, theoretically could be lymphangitic carcinomatosis but this is unlikely. The rest of the gastric wall is unremarkable. Correlate with endoscopic results if not already done. The small bowel is normal caliber. The cecum is mobile located just above the bladder and the appendix is normal. There is a large amount of retained stool in the ascending colon. There are left-sided diverticula without diverticulitis or colitis. Vascular/Lymphatic: Aortic atherosclerosis. No enlarged abdominal or pelvic lymph nodes. Reproductive: Status post hysterectomy. No adnexal masses. Other: Small umbilical and inguinal fat hernias. No incarcerated hernia. No free air, free hemorrhage or free fluid. Musculoskeletal: No acute or significant  osseous findings. Review of the MIP images confirms the above findings. IMPRESSION: 1. No evidence of arterial dilatation or embolus. 2. Aortic atherosclerosis. 3. Lower lobe bronchitis with air trapping and small airways disease. 4. Increased wall prominence at the EG junction with hazy edema surrounding the EG junction extending into the upper abdomen just below the hiatus, with clustered subcentimeter celiac lymph nodes. The surrounding edema is likely inflammatory or infectious. No free air is seen in the abdomen and no pneumomediastinum. 5. Trace air in the folds along the gastric side of the EG junction, which could indicate air within an EG junction ulcer or within a mucosal tear, or could simply be within a thickened fold. Correlate with endoscopic results if not already done. 6. Constipation and diverticulosis.  Mobile cecum. 7. Mildly prominent liver with mild steatosis. 8. Small umbilical and inguinal fat hernias.  Aortic Atherosclerosis (ICD10-I70.0). Electronically Signed   By: Almira Bar M.D.   On: 03/16/2023 05:10   CT ABDOMEN PELVIS W CONTRAST  Result Date: 03/16/2023 CLINICAL DATA:  Abdominal pain. Prior history of esophageal cancer. Chest pain. EXAM: CT ANGIOGRAPHY CHEST CT ABDOMEN AND PELVIS WITH CONTRAST TECHNIQUE: Multidetector CT imaging of the chest was performed using the standard protocol during bolus administration of intravenous contrast. Multiplanar CT image reconstructions and MIPs were obtained to evaluate the vascular anatomy. Multidetector CT imaging of the abdomen and pelvis was performed using the standard protocol during bolus administration of intravenous contrast. RADIATION DOSE REDUCTION: This exam was performed according to the departmental dose-optimization program which includes automated exposure control, adjustment of the mA and/or kV according to patient size and/or use of iterative reconstruction technique. CONTRAST:  75mL OMNIPAQUE IOHEXOL 350 MG/ML SOLN  COMPARISON:  PA Lat chest today, PA Lat chest 06/07/2022, partial chest CT for CTA coronary arteries 07/08/2022, and CT abdomen and pelvis with IV contrast 04/11/2020. FINDINGS: CTA CHEST FINDINGS Cardiovascular: The cardiac size is normal. There is no significant pericardial fluid. There is no visible coronary artery calcification. The pulmonary arteries are normal caliber without evidence of embolic disease. The pulmonary veins are decompressed. Minimal calcific plaques noted in the aorta in the distal arch and descending segment. There is no aneurysm, stenosis or dissection. The great vessels are clear. There is a normal variant brachiobicarotid trunk. Mediastinum/Nodes: No enlarged mediastinal, hilar, or axillary lymph nodes. Thyroid gland and trachea demonstrate no significant findings. There is mild-to-moderate increased wall prominence at the EG junction with hazy edema alongside the most distal esophagus. Correlate with endoscopic results if not already done. There is no pneumomediastinum or mediastinal fluid collections. Lungs/Pleura: There is mild elevation right hemidiaphragm probably due to eventration. Bronchial thickening noted in the lower lobes, with mosaic attenuation compatible with air trapping and small airways disease. No bronchiectasis or bronchial plugging is seen. No focal pneumonia is evident. Chronic linear scar-like opacity again noted lateral left lower lobe base and anterior right middle lobe. No nodules or active infiltrates are seen. Musculoskeletal: No chest wall abnormality. No acute or significant osseous findings. Review of the MIP images confirms the above findings. CT ABDOMEN and PELVIS FINDINGS Hepatobiliary: The liver is 23.5 cm length, mildly steatotic without mass enhancement. The gallbladder and bile ducts are unremarkable. Pancreas: No abnormality Spleen: No abnormality.  No splenomegaly. Adrenals/Urinary Tract: Adrenal glands are unremarkable. Kidneys are normal, without  renal calculi, focal lesion, or hydronephrosis. Bladder is unremarkable. Stomach/Bowel: As above there is mild-to-moderate prominence of the wall of the EG junction. Edema surrounds the EG junction continuing into the abdomen just below the hiatus, where there are clustered subcentimeter celiac lymph nodes in the area. There is trace air in the folds along the gastric side of the EG junction, which could indicate air within an EG junction ulcer or within a mucosal tear, or could simply be within a thickened fold. The adjacent edema is likely inflammatory or infectious, theoretically could be lymphangitic carcinomatosis but this is unlikely. The rest of the gastric wall is unremarkable. Correlate with endoscopic results if not already done. The small bowel is normal caliber. The cecum is mobile located just above the bladder and the appendix is normal. There is a large amount of retained stool in the ascending colon. There are left-sided diverticula without diverticulitis or colitis. Vascular/Lymphatic: Aortic atherosclerosis. No enlarged abdominal or pelvic lymph nodes. Reproductive: Status post hysterectomy. No adnexal masses. Other: Small umbilical  and inguinal fat hernias. No incarcerated hernia. No free air, free hemorrhage or free fluid. Musculoskeletal: No acute or significant osseous findings. Review of the MIP images confirms the above findings. IMPRESSION: 1. No evidence of arterial dilatation or embolus. 2. Aortic atherosclerosis. 3. Lower lobe bronchitis with air trapping and small airways disease. 4. Increased wall prominence at the EG junction with hazy edema surrounding the EG junction extending into the upper abdomen just below the hiatus, with clustered subcentimeter celiac lymph nodes. The surrounding edema is likely inflammatory or infectious. No free air is seen in the abdomen and no pneumomediastinum. 5. Trace air in the folds along the gastric side of the EG junction, which could indicate air  within an EG junction ulcer or within a mucosal tear, or could simply be within a thickened fold. Correlate with endoscopic results if not already done. 6. Constipation and diverticulosis.  Mobile cecum. 7. Mildly prominent liver with mild steatosis. 8. Small umbilical and inguinal fat hernias. Aortic Atherosclerosis (ICD10-I70.0). Electronically Signed   By: Almira BarKeith  Chesser M.D.   On: 03/16/2023 05:10   DG Chest 2 View  Result Date: 03/16/2023 CLINICAL DATA:  Chest pain. EXAM: CHEST - 2 VIEW COMPARISON:  June 08, 2019 FINDINGS: The heart size and mediastinal contours are within normal limits. Both lungs are clear. The visualized skeletal structures are unremarkable. IMPRESSION: No active cardiopulmonary disease. Electronically Signed   By: Aram Candelahaddeus  Houston M.D.   On: 03/16/2023 00:46    Procedures Procedures    Medications Ordered in ED Medications  alum & mag hydroxide-simeth (MAALOX/MYLANTA) 200-200-20 MG/5ML suspension 30 mL (has no administration in time range)    And  lidocaine (XYLOCAINE) 2 % viscous mouth solution 15 mL (has no administration in time range)  ketorolac (TORADOL) 15 MG/ML injection 15 mg (has no administration in time range)  sodium chloride 0.9 % bolus 1,000 mL (0 mLs Intravenous Stopped 03/16/23 0402)  iohexol (OMNIPAQUE) 350 MG/ML injection 75 mL (75 mLs Intravenous Contrast Given 03/16/23 0402)    ED Course/ Medical Decision Making/ A&P Clinical Course as of 03/16/23 0628  Sun Mar 16, 2023  0315 Aside from CBG of 298, work up reassuring. Troponin negative, though symptoms less concerning for ACS. Plan for CTA to further assess pain. Risk for Boerhaave's given recent esophageal dissection, though also likely carries risk of hypercoagulation given diagnosis of malignancy.  By my interpretation, chest x-ray without evidence of pneumothorax, pneumonia, pleural effusion, cardiomegaly. [KH]  [phone] Consult placed to [facility] to discuss imaging results.  Will attempt to power share  images in the interim pending callback. [KH]  [phone] Patient reporting 7/10 pain. Will attempt pain control with GI cocktail, Toradol. [KH]    Clinical Course User Index [KH] Antony MaduraHumes, Ixel Boehning, PA-C                             Medical Decision Making Amount and/or Complexity of Data Reviewed Labs: ordered. Radiology: ordered.  Risk Prescription drug management.   This patient presents to the ED for concern of chest pain, this involves an extensive number of treatment options, and is a complaint that carries with it a high risk of complications and morbidity.  The differential diagnosis includes ACS vs PE vs pleural effusion vs PTX vs PNA vs Boerhaave's vs esophageal spasm   Co morbidities that complicate the patient evaluation  Esophageal adenocarcinoma   Additional history obtained:  Additional history obtained from daughter, at bedside External  records from outside source obtained and reviewed including GI procedural note from 03/13/23.   Lab Tests:  I Ordered, and personally interpreted labs.  The pertinent results include:  CBG 298 w/o anion gap or other evidence of DKA. Troponin negative x2. ALT 54, otherwise hepatic function and lipase WNL.   Imaging Studies ordered:  I ordered imaging studies including CTA chest as well as CT abdomen/pelvis  I independently visualized and interpreted imaging which showed trace air in the folds along the gastric side of the EG junction. No pneumomediastinum or pulmonary embolus. I agree with the radiologist interpretation   Cardiac Monitoring:  The patient was maintained on a cardiac monitor.  I personally viewed and interpreted the cardiac monitored which showed an underlying rhythm of: NSR   Medicines ordered and prescription drug management:  I ordered medication including Toradol and GI cocktail for chest pain  I have reviewed the patients home medicines and have made adjustments as needed   Test Considered:  D-dimer - negated  by CTA ordered   Consultations Obtained:  I requested consultation with [facility] to discuss imaging findings as well as necessary plan; pending call back.   Problem List / ED Course:  As above   Reevaluation:  After the interventions noted above, I reevaluated the patient and found that they have :stayed the same   Dispostion:  Patient signed out to [place], [place] pending GI consultation at [facility] and reassessment for disposition.          Final Clinical Impression(s) / ED Diagnoses Final diagnoses:  Nonspecific chest pain    Rx / DC Orders ED Discharge Orders     None         Antony Madura, PA-C 03/16/23 [id number]    Marily Memos, MD 03/16/23 [phone]

## 2023-03-16 NOTE — ED Notes (Addendum)
EKG given to PA [facility]

## 2023-05-27 DIAGNOSIS — M5412 Radiculopathy, cervical region: Secondary | ICD-10-CM | POA: Insufficient documentation

## 2023-05-27 DIAGNOSIS — M5030 Other cervical disc degeneration, unspecified cervical region: Secondary | ICD-10-CM | POA: Insufficient documentation

## 2023-06-26 LAB — LAB REPORT - SCANNED
A1c: 8.1
EGFR: 95.7

## 2023-07-17 DIAGNOSIS — M75110 Incomplete rotator cuff tear or rupture of unspecified shoulder, not specified as traumatic: Secondary | ICD-10-CM | POA: Insufficient documentation

## 2023-08-13 DIAGNOSIS — M7501 Adhesive capsulitis of right shoulder: Secondary | ICD-10-CM | POA: Insufficient documentation

## 2023-09-16 LAB — LAB REPORT - SCANNED
A1c: 8.3
EGFR: 92.7

## 2023-09-29 ENCOUNTER — Ambulatory Visit: Payer: Medicaid Other | Admitting: Cardiology

## 2023-09-29 NOTE — Progress Notes (Addendum)
 Cardiology Office Note:  .   Date:  09/30/2023  ID:  Vanessa Parks, DOB 27-Aug-1976, MRN [id number] PCP: Vanessa Emery, NP  [facility] Providers Cardiologist:  None    History of Present Illness: Vanessa Parks   Vanessa Parks is a 47 y.o. female with a past medical history of hypertension, DM 2, dyslipidemia, esophageal cancer.   Most recently evaluated Dr. Bing Matter on 10/24/2022.  She had been previously referred to [place] for evaluation of chest pain, she had been evaluated in the emergency department, there was some Q wave changes in V1 and V2 cardiac enzymes were negative, she underwent a coronary CTA which showed a calcium score of 0.  She had been doing well but recently began having chest pain again which prompted her visit with Dr. Bing Matter, was felt to be related to a gallbladder issue and she was following with GI with plans for HIDA scan.  An echocardiogram was repeated which revealed an EF of 60 to 65%, no valvular abnormalities.  Since she was last eval waited on her office she was diagnosed with early stage esophageal cancer, underwent resection, currently cancer free.  She offers no complaints from a cardiac perspective, she is still bothered by GI symptoms.  She was recently started on insulin for her diabetes, has noticed a weight gain along with his weight gain her blood pressure is increased. She denies chest pain, palpitations, dyspnea, pnd, orthopnea, n, v, dizziness, syncope, edema, weight gain, or early satiety.   ROS: Review of Systems  All other systems reviewed and are negative.    Studies Reviewed: .        Cardiac Studies & Procedures       ECHOCARDIOGRAM  ECHOCARDIOGRAM COMPLETE 11/08/2022  Narrative ECHOCARDIOGRAM REPORT    Patient Name:   Vanessa Parks Date of Exam: 11/08/2022 Medical Rec #:  [id number]          Height:       68.0 in Accession #:    [id number]         Weight:       213.2 lb Date of Birth:  10/22/1976          BSA:           2.100 m Patient Age:    46 years           BP:           110/78 mmHg Patient Gender: F                  HR:           90 bpm. Exam Location:  [place]  Procedure: 2D Echo, Cardiac Doppler, Color Doppler and Strain Analysis  Indications:    Shortness of breath [R06.02 (ICD-10-CM)]  History:        Patient has no prior history of Echocardiogram examinations. Risk Factors:Hypertension and Dyslipidemia.  Sonographer:    Margreta Journey RDCS Referring Phys: [id number] ROBERT J KRASOWSKI  IMPRESSIONS   1. Sigmoid septum noted with no LVOT gradient. GLS -17.3. Left ventricular ejection fraction, by estimation, is 60 to 65%. The left ventricle has normal function. The left ventricle has no regional wall motion abnormalities. Left ventricular diastolic parameters were normal. 2. Right ventricular systolic function is normal. The right ventricular size is normal. 3. The mitral valve is normal in structure. No evidence of mitral valve regurgitation. No evidence of mitral stenosis. 4. The aortic valve is normal in structure. Aortic valve  regurgitation is not visualized. No aortic stenosis is present. 5. The inferior vena cava is normal in size with greater than 50% respiratory variability, suggesting right atrial pressure of 3 mmHg.  FINDINGS Left Ventricle: Sigmoid septum noted with no LVOT gradient. GLS -17.3. Left ventricular ejection fraction, by estimation, is 60 to 65%. The left ventricle has normal function. The left ventricle has no regional wall motion abnormalities. The left ventricular internal cavity size was normal in size. There is no left ventricular hypertrophy. Left ventricular diastolic parameters were normal.  Right Ventricle: The right ventricular size is normal. No increase in right ventricular wall thickness. Right ventricular systolic function is normal.  Left Atrium: Left atrial size was normal in size.  Right Atrium: Right atrial size was normal in size.  Pericardium:  There is no evidence of pericardial effusion.  Mitral Valve: The mitral valve is normal in structure. No evidence of mitral valve regurgitation. No evidence of mitral valve stenosis.  Tricuspid Valve: The tricuspid valve is normal in structure. Tricuspid valve regurgitation is not demonstrated. No evidence of tricuspid stenosis.  Aortic Valve: The aortic valve is normal in structure. Aortic valve regurgitation is not visualized. No aortic stenosis is present.  Pulmonic Valve: The pulmonic valve was normal in structure. Pulmonic valve regurgitation is not visualized. No evidence of pulmonic stenosis.  Aorta: The aortic root is normal in size and structure.  Venous: The inferior vena cava is normal in size with greater than 50% respiratory variability, suggesting right atrial pressure of 3 mmHg.  IAS/Shunts: No atrial level shunt detected by color flow Doppler.   LEFT VENTRICLE PLAX 2D LVIDd:         3.60 cm   Diastology LVIDs:         2.40 cm   LV e' medial:    6.09 cm/s LV PW:         1.10 cm   LV E/e' medial:  12.3 LV IVS:        1.20 cm   LV e' lateral:   7.72 cm/s LVOT diam:     2.10 cm   LV E/e' lateral: 9.7 LV SV:         53 LV SV Index:   25 LVOT Area:     3.46 cm   RIGHT VENTRICLE            IVC RV Basal diam:  2.30 cm    IVC diam: 1.80 cm RV S prime:     9.03 cm/s TAPSE (M-mode): 2.8 cm  LEFT ATRIUM             Index        RIGHT ATRIUM          Index LA diam:        3.70 cm 1.76 cm/m   RA Area:     8.86 cm LA Vol (A2C):   33.0 ml 15.71 ml/m  RA Volume:   13.60 ml 6.48 ml/m LA Vol (A4C):   21.3 ml 10.14 ml/m LA Biplane Vol: 28.6 ml 13.62 ml/m AORTIC VALVE LVOT Vmax:   83.30 cm/s LVOT Vmean:  56.200 cm/s LVOT VTI:    0.152 m  AORTA Ao Root diam: 3.50 cm Ao Asc diam:  3.50 cm Ao Desc diam: 1.60 cm  MITRAL VALVE MV Area (PHT): 3.68 cm    SHUNTS MV Decel Time: 206 msec    Systemic VTI:  0.15 m MV E velocity: 74.90 cm/s  Systemic Diam: 2.10 cm MV  A  velocity: 77.70 cm/s MV E/A ratio:  0.96  Vanessa Balsam MD Electronically signed by Vanessa Balsam MD Signature Date/Time: 11/08/2022/4:20:06 PM    Final     CT SCANS  CT CORONARY MORPH W/CTA COR W/SCORE 07/08/2022  Addendum 07/09/2022  9:18 AM ADDENDUM REPORT: 07/09/2022 09:15  EXAM: OVER-READ INTERPRETATION  CT CHEST  The following report is an over-read performed by radiologist Dr. Schuyler Amor [facility] on 07/09/2022. This over-read does not include interpretation of cardiac or coronary anatomy or pathology. The coronary CTA interpretation by the cardiologist is attached.  COMPARISON:  CT AP 04/11/2020  FINDINGS: Vascular: No acute abnormality.  Mediastinum/nodes: No mass or adenopathy identified.  Lungs/pleura: No pleural effusion, airspace consolidation, atelectasis, or pneumothorax. No suspicious lung nodules.  Upper abdomen: No acute abnormality.  Musculoskeletal: No acute or suspicious osseous findings.  IMPRESSION: Negative over-read.   Electronically Signed By: Signa Kell M.D. On: 07/09/2022 09:15  Narrative CLINICAL DATA:  This is a 47 year old female with anginal symptoms.  EXAM: Cardiac/Coronary  CTA  TECHNIQUE: The patient was scanned on a [employer].  FINDINGS: A 100 kV prospective scan was triggered in the descending thoracic aorta at 111 HU's. Axial non-contrast 3 mm slices were carried out through the heart. The data set was analyzed on a dedicated work station and scored using the Agatson method. Gantry rotation speed was 250 msecs and collimation was .6 mm. No beta blockade and 0.8 mg of sl NTG was given. The 3D data set was reconstructed in 5% intervals of the 67-82 % of the R-R cycle. Diastolic phases were analyzed on a dedicated work station using MPR, MIP and VRT modes. The patient received 80 cc of contrast.  Image Quality: Fair with misregistration artifact.  Aorta: Normal size.  No  calcifications.  No dissection.  Aortic Valve:  Trileaflet.  No calcifications.  Coronary Arteries:  Normal coronary origin.  Right dominance.  RCA is a large dominant artery that gives rise to PDA and PLA. There is no plaque.  Left main is a large artery that gives rise to LAD and LCX arteries.  LAD is a large vessel that has no plaque.  LCX is a non-dominant artery that gives rise to one large OM1 branch. There is no plaque.  Coronary Calcium Score:  Left main: 0  Left anterior descending artery: 0  Left circumflex artery: 0  Right coronary artery: 0  Total: 0  Percentile: 0  Other findings:  Normal pulmonary vein drainage into the left atrium.  Normal left atrial appendage without a thrombus.  Normal size of the pulmonary artery.  IMPRESSION: 1. Coronary calcium score of 0. This was 0 percentile for age and sex matched control.  2. Normal coronary origin with right dominance.  3. CAD-RADS 0. No evidence of CAD (0%). Consider non-atherosclerotic causes of chest pain.  The noncardiac portion of this study will be interpreted in separate report by the radiologist.  Electronically Signed: By: Thomasene Ripple D.O. On: 07/08/2022 16:37          Risk Assessment/Calculations:     HYPERTENSION CONTROL Vitals:   09/30/23 1358 09/30/23 1439  BP: (!) 144/96 (!) 144/88    The patient's blood pressure is elevated above target today.  In order to address the patient's elevated BP: A current anti-hypertensive medication was adjusted today.          Physical Exam:   VS:  BP (!) 144/88   Pulse 80  Ht 5\' 8"  (1.727 m)   Wt 216 lb (98 kg)   LMP 09/21/2016 Comment: tubal ligation  SpO2 97%   BMI 32.84 kg/m    Wt Readings from Last 3 Encounters:  09/30/23 216 lb (98 kg)  10/24/22 213 lb 3.2 oz (96.7 kg)  07/04/22 210 lb 6.4 oz (95.4 kg)    GEN: Well nourished, well developed in no acute distress NECK: No JVD; No carotid bruits CARDIAC: RRR, no murmurs,  rubs, gallops RESPIRATORY:  Clear to auscultation without rales, wheezing or rhonchi  ABDOMEN: Soft, non-tender, non-distended EXTREMITIES:  No edema; No deformity   ASSESSMENT AND PLAN: .   Atypical chest pain-cardiac workup was unrevealing, calcium score of 0.  Was felt to be most likely related to hiatal hernia, GERD.  Hypertension-blood pressure is elevated in the office today, she was recently started on insulin has had a 17 pound weight gain, she checks her blood pressure at home and noticed that has been elevated at home as well.  Will increase her Benicar to 40 mg daily.  Will recheck kidney function in 2 weeks.  Dyslipidemia- is monitored by her PCP, continue Crestor 10 mg daily.  Esophageal cancer- s/p esophageal resection.        Dispo: Increase Benicar to 40 mg daily, follow-up BMET in 2 weeks.  Follow-up with cardiology as needed.  Signed, Flossie Dibble, NP

## 2023-09-30 ENCOUNTER — Encounter: Payer: Self-pay | Admitting: Cardiology

## 2023-09-30 ENCOUNTER — Ambulatory Visit: Payer: Medicaid Other | Attending: Cardiology | Admitting: Cardiology

## 2023-09-30 VITALS — BP 144/88 | HR 80 | Ht 68.0 in | Wt 216.0 lb

## 2023-09-30 DIAGNOSIS — I10 Essential (primary) hypertension: Secondary | ICD-10-CM

## 2023-09-30 DIAGNOSIS — E782 Mixed hyperlipidemia: Secondary | ICD-10-CM

## 2023-09-30 DIAGNOSIS — R072 Precordial pain: Secondary | ICD-10-CM

## 2023-09-30 DIAGNOSIS — E119 Type 2 diabetes mellitus without complications: Secondary | ICD-10-CM

## 2023-09-30 DIAGNOSIS — R0602 Shortness of breath: Secondary | ICD-10-CM | POA: Diagnosis not present

## 2023-09-30 DIAGNOSIS — Z79899 Other long term (current) drug therapy: Secondary | ICD-10-CM

## 2023-09-30 MED ORDER — OLMESARTAN MEDOXOMIL 40 MG PO TABS: 40.0000 mg | ORAL_TABLET | Freq: Every day | ORAL | 3 refills | Status: AC

## 2023-09-30 NOTE — Patient Instructions (Signed)
Medication Instructions:  Your physician has recommended you make the following change in your medication:  Increase benicar to 40 mg once daily  *If you need a refill on your cardiac medications before your next appointment, please call your pharmacy*   Lab Work: Your physician recommends that you return for lab work in: 2 Weeks for Basic Metabolic Panel Lab opens at 8am. You DO NOT NEED an appointment. Best time to come is between 8am and 12noon and between 1:30 and 4:30. If you have been asked to fast for your blood work please have nothing to eat or drink after midnight. You may have water.   If you have labs (blood work) drawn today and your tests are completely normal, you will receive your results only by: MyChart Message (if you have MyChart) OR A paper copy in the mail If you have any lab test that is abnormal or we need to change your treatment, we will call you to review the results.   Testing/Procedures: NONE   Follow-Up: At [facility], you and your health needs are our priority.  As part of our continuing mission to provide you with exceptional heart care, we have created designated Provider Care Teams.  These Care Teams include your primary Cardiologist (physician) and Advanced Practice Providers (APPs -  Physician Assistants and Nurse Practitioners) who all work together to provide you with the care you need, when you need it.  We recommend signing up for the patient portal called "MyChart".  Sign up information is provided on this After Visit Summary.  MyChart is used to connect with patients for Virtual Visits (Telemedicine).  Patients are able to view lab/test results, encounter notes, upcoming appointments, etc.  Non-urgent messages can be sent to your provider as well.   To learn more about what you can do with MyChart, go to [email or web address].    Your next appointment:  If Symptoms Worsen or fail to improve    Provider:   Wallis Bamberg, NP  Rosalita Levan)    Other Instructions

## 2023-10-01 ENCOUNTER — Telehealth: Payer: Self-pay | Admitting: Cardiology

## 2023-10-01 NOTE — Telephone Encounter (Signed)
Pt request that under assessment plan on  AVS that it does not state she is cancer free. Per [facility] it does not need to state that for 5 years. Please Advise

## 2023-10-02 ENCOUNTER — Ambulatory Visit: Payer: Medicaid Other | Admitting: Cardiology

## 2023-10-02 DIAGNOSIS — K8050 Calculus of bile duct without cholangitis or cholecystitis without obstruction: Secondary | ICD-10-CM | POA: Insufficient documentation

## 2023-10-02 DIAGNOSIS — K449 Diaphragmatic hernia without obstruction or gangrene: Secondary | ICD-10-CM | POA: Insufficient documentation

## 2023-12-09 NOTE — Progress Notes (Signed)
 Cardiology Office Note:  .   Date:  12/11/2023  ID:  Vanessa Parks, DOB 10/10/1976, MRN [id number] PCP: Vanessa Angeline FALCON, NP  [facility] Providers Cardiologist:  None    History of Present Illness: Vanessa   Merit Parks is a 47 y.o. female with a past medical history of hypertension, DM 2, dyslipidemia, esophageal cancer.   11/08/2022 echo EF 6 to 65%, sigmoid septum noted 06/28/2022 coronary CTA calcium score 0  Most recently evaluated Dr. Bernie on 10/24/2022.  She had been previously referred to us  for evaluation of chest pain, she had been evaluated in the emergency department, there was some Q wave changes in V1 and V2 cardiac enzymes were negative, she underwent a coronary CTA which showed a calcium score of 0.  She had been doing well but recently began having chest pain again which prompted her visit with Dr. Bernie, was felt to be related to a gallbladder issue and she was following with GI with plans for HIDA scan.  An echocardiogram was repeated which revealed an EF of 60 to 65%, no valvular abnormalities.  Evaluated 09/30/2023, since she was last eval waited on her office she was diagnosed with early stage esophageal cancer, underwent resection, follows with [facility].  She was stable from a cardiac perspective, bothered by GI symptoms.  She subsequently underwent cholecystectomy.  We advise she can follow-up with our office on a as needed basis.  She presents today accompanied by her daughter after being told she had aortic atherosclerosis on CT imaging, also with concerns of swelling of her feet and hands.  She feels like the swelling started approximately a month ago, starts in the evening typically subsides in the morning.  She also endorses some chest heaviness when she lays down at night.  She apparently had a sleep evaluation completed by her PCP which revealed no apnea however she had been referred to pulmonology and they recommended she complete an in-house sleep  evaluation, also undergo PFTs-- she is waiting to hear back form their office. She denies chest pain, palpitations, dyspnea, pnd, orthopnea, n, v, dizziness, syncope, edema, weight gain, or early satiety.    ROS: Review of Systems  All other systems reviewed and are negative.    Studies Reviewed: .        Cardiac Studies & Procedures      ECHOCARDIOGRAM  ECHOCARDIOGRAM COMPLETE 11/08/2022  Narrative ECHOCARDIOGRAM REPORT    Patient Name:   Vanessa Parks Date of Exam: 11/08/2022 Medical Rec #:  [id number]          Height:       68.0 in Accession #:    [id number]         Weight:       213.2 lb Date of Birth:  1976-08-20          BSA:          2.100 m Patient Age:    46 years           BP:           110/78 mmHg Patient Gender: F                  HR:           90 bpm. Exam Location:  [facility]  Procedure: 2D Echo, Cardiac Doppler, Color Doppler and Strain Analysis  Indications:    Shortness of breath [R06.02 (ICD-10-CM)]  History:        Patient  has no prior history of Echocardiogram examinations. Risk Factors:Hypertension and Dyslipidemia.  Sonographer:    Charlie Jointer RDCS Referring Phys: [id number] ROBERT J KRASOWSKI  IMPRESSIONS   1. Sigmoid septum noted with no LVOT gradient. GLS -17.3. Left ventricular ejection fraction, by estimation, is 60 to 65%. The left ventricle has normal function. The left ventricle has no regional wall motion abnormalities. Left ventricular diastolic parameters were normal. 2. Right ventricular systolic function is normal. The right ventricular size is normal. 3. The mitral valve is normal in structure. No evidence of mitral valve regurgitation. No evidence of mitral stenosis. 4. The aortic valve is normal in structure. Aortic valve regurgitation is not visualized. No aortic stenosis is present. 5. The inferior vena cava is normal in size with greater than 50% respiratory variability, suggesting right atrial pressure of 3  mmHg.  FINDINGS Left Ventricle: Sigmoid septum noted with no LVOT gradient. GLS -17.3. Left ventricular ejection fraction, by estimation, is 60 to 65%. The left ventricle has normal function. The left ventricle has no regional wall motion abnormalities. The left ventricular internal cavity size was normal in size. There is no left ventricular hypertrophy. Left ventricular diastolic parameters were normal.  Right Ventricle: The right ventricular size is normal. No increase in right ventricular wall thickness. Right ventricular systolic function is normal.  Left Atrium: Left atrial size was normal in size.  Right Atrium: Right atrial size was normal in size.  Pericardium: There is no evidence of pericardial effusion.  Mitral Valve: The mitral valve is normal in structure. No evidence of mitral valve regurgitation. No evidence of mitral valve stenosis.  Tricuspid Valve: The tricuspid valve is normal in structure. Tricuspid valve regurgitation is not demonstrated. No evidence of tricuspid stenosis.  Aortic Valve: The aortic valve is normal in structure. Aortic valve regurgitation is not visualized. No aortic stenosis is present.  Pulmonic Valve: The pulmonic valve was normal in structure. Pulmonic valve regurgitation is not visualized. No evidence of pulmonic stenosis.  Aorta: The aortic root is normal in size and structure.  Venous: The inferior vena cava is normal in size with greater than 50% respiratory variability, suggesting right atrial pressure of 3 mmHg.  IAS/Shunts: No atrial level shunt detected by color flow Doppler.   LEFT VENTRICLE PLAX 2D LVIDd:         3.60 cm   Diastology LVIDs:         2.40 cm   LV e' medial:    6.09 cm/s LV PW:         1.10 cm   LV E/e' medial:  12.3 LV IVS:        1.20 cm   LV e' lateral:   7.72 cm/s LVOT diam:     2.10 cm   LV E/e' lateral: 9.7 LV SV:         53 LV SV Index:   25 LVOT Area:     3.46 cm   RIGHT VENTRICLE            IVC RV  Basal diam:  2.30 cm    IVC diam: 1.80 cm RV S prime:     9.03 cm/s TAPSE (M-mode): 2.8 cm  LEFT ATRIUM             Index        RIGHT ATRIUM          Index LA diam:        3.70 cm 1.76 cm/m   RA Area:  8.86 cm LA Vol (A2C):   33.0 ml 15.71 ml/m  RA Volume:   13.60 ml 6.48 ml/m LA Vol (A4C):   21.3 ml 10.14 ml/m LA Biplane Vol: 28.6 ml 13.62 ml/m AORTIC VALVE LVOT Vmax:   83.30 cm/s LVOT Vmean:  56.200 cm/s LVOT VTI:    0.152 m  AORTA Ao Root diam: 3.50 cm Ao Asc diam:  3.50 cm Ao Desc diam: 1.60 cm  MITRAL VALVE MV Area (PHT): 3.68 cm    SHUNTS MV Decel Time: 206 msec    Systemic VTI:  0.15 m MV E velocity: 74.90 cm/s  Systemic Diam: 2.10 cm MV A velocity: 77.70 cm/s MV E/A ratio:  0.96  Lamar Fitch MD Electronically signed by Lamar Fitch MD Signature Date/Time: 11/08/2022/4:20:06 PM    Final    CT SCANS  CT CORONARY MORPH W/CTA COR W/SCORE 07/08/2022  Addendum 07/09/2022  9:18 AM ADDENDUM REPORT: 07/09/2022 09:15  EXAM: OVER-READ INTERPRETATION  CT CHEST  The following report is an over-read performed by radiologist Dr. Waddell Cola [facility] on 07/09/2022. This over-read does not include interpretation of cardiac or coronary anatomy or pathology. The coronary CTA interpretation by the cardiologist is attached.  COMPARISON:  CT AP 04/11/2020  FINDINGS: Vascular: No acute abnormality.  Mediastinum/nodes: No mass or adenopathy identified.  Lungs/pleura: No pleural effusion, airspace consolidation, atelectasis, or pneumothorax. No suspicious lung nodules.  Upper abdomen: No acute abnormality.  Musculoskeletal: No acute or suspicious osseous findings.  IMPRESSION: Negative over-read.   Electronically Signed By: Waddell Calk M.D. On: 07/09/2022 09:15  Narrative CLINICAL DATA:  This is a 47 year old female with anginal symptoms.  EXAM: Cardiac/Coronary  CTA  TECHNIQUE: The patient was scanned on a [employer].  FINDINGS: A 100 kV prospective scan was triggered in the descending thoracic aorta at 111 HU's. Axial non-contrast 3 mm slices were carried out through the heart. The data set was analyzed on a dedicated work station and scored using the Agatson method. Gantry rotation speed was 250 msecs and collimation was .6 mm. No beta blockade and 0.8 mg of sl NTG was given. The 3D data set was reconstructed in 5% intervals of the 67-82 % of the R-R cycle. Diastolic phases were analyzed on a dedicated work station using MPR, MIP and VRT modes. The patient received 80 cc of contrast.  Image Quality: Fair with misregistration artifact.  Aorta: Normal size.  No calcifications.  No dissection.  Aortic Valve:  Trileaflet.  No calcifications.  Coronary Arteries:  Normal coronary origin.  Right dominance.  RCA is a large dominant artery that gives rise to PDA and PLA. There is no plaque.  Left main is a large artery that gives rise to LAD and LCX arteries.  LAD is a large vessel that has no plaque.  LCX is a non-dominant artery that gives rise to one large OM1 branch. There is no plaque.  Coronary Calcium Score:  Left main: 0  Left anterior descending artery: 0  Left circumflex artery: 0  Right coronary artery: 0  Total: 0  Percentile: 0  Other findings:  Normal pulmonary vein drainage into the left atrium.  Normal left atrial appendage without a thrombus.  Normal size of the pulmonary artery.  IMPRESSION: 1. Coronary calcium score of 0. This was 0 percentile for age and sex matched control.  2. Normal coronary origin with right dominance.  3. CAD-RADS 0. No evidence of CAD (0%). Consider non-atherosclerotic causes of chest  pain.  The noncardiac portion of this study will be interpreted in separate report by the radiologist.  Electronically Signed: By: Kardie  Tobb D.O. On: 07/08/2022 16:37          Risk Assessment/Calculations:              Physical Exam:   VS:  BP 124/86 (BP Location: Left Arm, Patient Position: Sitting, Cuff Size: Normal)   Pulse 90   Ht 5' 8 (1.727 m)   Wt 216 lb (98 kg)   LMP 09/21/2016 Comment: tubal ligation  SpO2 97%   BMI 32.84 kg/m    Wt Readings from Last 3 Encounters:  12/11/23 216 lb (98 kg)  09/30/23 216 lb (98 kg)  10/24/22 213 lb 3.2 oz (96.7 kg)    GEN: Well nourished, well developed in no acute distress NECK: No JVD; No carotid bruits CARDIAC: RRR, no murmurs, rubs, gallops RESPIRATORY:  Clear to auscultation without rales, wheezing or rhonchi  ABDOMEN: Soft, non-tender, non-distended EXTREMITIES:  No edema appreciated today; No deformity   ASSESSMENT AND PLAN: .   Atypical chest pain-cardiac workup was unrevealing, calcium score of 0.  Was felt to be most likely related to hiatal hernia, GERD, cholecystitis.  Aortic atherosclerosis-this was noted on CT imaging recently, previously had a calcium score of 0.  Will request recent lab work from PCP including cholesterol panel, prefer her LDL to be less than 70.  Continue aspirin 81 mg daily.  DOE/pedal edema-this is likely multifactorial, could be a component of deconditioning from recent surgical procedure however with seemingly new pedal edema, will repeat echocardiogram for any contributory causes.  Discussed supportive measures for pedal edema such as compression socks, elevation, watching sodium intake.  Hypertension-blood pressure is well-controlled at 124/86.  Continue Benicar  40 mg daily.  Dyslipidemia- is monitored by her PCP, continue Crestor 10 mg daily.  Esophageal cancer- s/p esophageal resection, follows with [facility].   Snoring-STOP-BANG score of 5, at home sleep evaluation was arranged by her PCP which revealed no sleep apnea.  She was then referred to local pulmonologist she felt she had severe asthma, also wanted to reevaluate her sleep apnea with an in-house sleep evaluation-as she states she has not heard anything back,  will reach out to their office to see if they can follow-up with her.  We did discuss that untreated OSA could also cause pedal edema.       Dispo: Echo, follow up based on results.   Signed, Delon JAYSON Hoover, NP

## 2023-12-11 ENCOUNTER — Encounter: Payer: Self-pay | Admitting: Cardiology

## 2023-12-11 ENCOUNTER — Ambulatory Visit: Payer: Medicaid Other | Attending: Cardiology | Admitting: Cardiology

## 2023-12-11 VITALS — BP 124/86 | HR 90 | Ht 68.0 in | Wt 216.0 lb

## 2023-12-11 DIAGNOSIS — R0609 Other forms of dyspnea: Secondary | ICD-10-CM

## 2023-12-11 DIAGNOSIS — E785 Hyperlipidemia, unspecified: Secondary | ICD-10-CM

## 2023-12-11 DIAGNOSIS — R0683 Snoring: Secondary | ICD-10-CM | POA: Diagnosis not present

## 2023-12-11 DIAGNOSIS — I700 Atherosclerosis of aorta: Secondary | ICD-10-CM

## 2023-12-11 DIAGNOSIS — R600 Localized edema: Secondary | ICD-10-CM

## 2023-12-11 DIAGNOSIS — I10 Essential (primary) hypertension: Secondary | ICD-10-CM

## 2023-12-11 NOTE — Patient Instructions (Signed)
 Medication Instructions:  Your physician recommends that you continue on your current medications as directed. Please refer to the Current Medication list given to you today.  *If you need a refill on your cardiac medications before your next appointment, please call your pharmacy*   Lab Work: None Ordered If you have labs (blood work) drawn today and your tests are completely normal, you will receive your results only by: MyChart Message (if you have MyChart) OR A paper copy in the mail If you have any lab test that is abnormal or we need to change your treatment, we will call you to review the results.   Testing/Procedures: Echocardiogram An echocardiogram is a test that uses sound waves (ultrasound) to produce images of the heart. Images from an echocardiogram can provide important information about: Heart size and shape. The size and thickness and movement of your heart's walls. Heart muscle function and strength. Heart valve function or if you have stenosis. Stenosis is when the heart valves are too narrow. If blood is flowing backward through the heart valves (regurgitation). A tumor or infectious growth around the heart valves. Areas of heart muscle that are not working well because of poor blood flow or injury from a heart attack. Aneurysm detection. An aneurysm is a weak or damaged part of an artery wall. The wall bulges out from the normal force of blood pumping through the body. Tell a health care provider about: Any allergies you have. All medicines you are taking, including vitamins, herbs, eye drops, creams, and over-the-counter medicines. Any blood disorders you have. Any surgeries you have had. Any medical conditions you have. Whether you are pregnant or may be pregnant. What are the risks? Generally, this is a safe test. However, problems may occur, including an allergic reaction to dye (contrast) that may be used during the test. What happens before the test? No  specific preparation is needed. You may eat and drink normally. What happens during the test?  You will take off your clothes from the waist up and put on a hospital gown. Electrodes or electrocardiogram (ECG)patches may be placed on your chest. The electrodes or patches are then connected to a device that monitors your heart rate and rhythm. You will lie down on a table for an ultrasound exam. A gel will be applied to your chest to help sound waves pass through your skin. A handheld device, called a transducer, will be pressed against your chest and moved over your heart. The transducer produces sound waves that travel to your heart and bounce back (or "echo" back) to the transducer. These sound waves will be captured in real-time and changed into images of your heart that can be viewed on a video monitor. The images will be recorded on a computer and reviewed by your health care provider. You may be asked to change positions or hold your breath for a short time. This makes it easier to get different views or better views of your heart. In some cases, you may receive contrast through an IV in one of your veins. This can improve the quality of the pictures from your heart. The procedure may vary among health care providers and hospitals. What can I expect after the test? You may return to your normal, everyday life, including diet, activities, and medicines, unless your health care provider tells you not to do that. Follow these instructions at home: It is up to you to get the results of your test. Ask your health care provider,  or the department that is doing the test, when your results will be ready. Keep all follow-up visits. This is important. Summary An echocardiogram is a test that uses sound waves (ultrasound) to produce images of the heart. Images from an echocardiogram can provide important information about the size and shape of your heart, heart muscle function, heart valve function, and  other possible heart problems. You do not need to do anything to prepare before this test. You may eat and drink normally. After the echocardiogram is completed, you may return to your normal, everyday life, unless your health care provider tells you not to do that. This information is not intended to replace advice given to you by your health care provider. Make sure you discuss any questions you have with your health care provider. Document Revised: 08/08/2021 Document Reviewed: 07/18/2020 Elsevier Patient Education  2023 Elsevier Inc.        Follow-Up: At [facility], you and your health needs are our priority.  As part of our continuing mission to provide you with exceptional heart care, we have created designated Provider Care Teams.  These Care Teams include your primary Cardiologist (physician) and Advanced Practice Providers (APPs -  Physician Assistants and Nurse Practitioners) who all work together to provide you with the care you need, when you need it.  We recommend signing up for the patient portal called "MyChart".  Sign up information is provided on this After Visit Summary.  MyChart is used to connect with patients for Virtual Visits (Telemedicine).  Patients are able to view lab/test results, encounter notes, upcoming appointments, etc.  Non-urgent messages can be sent to your provider as well.   To learn more about what you can do with MyChart, go to [email or web address].    Your next appointment:   Follow up as needed

## 2024-01-01 ENCOUNTER — Ambulatory Visit: Payer: Medicaid Other | Attending: Cardiology

## 2024-01-01 DIAGNOSIS — R0609 Other forms of dyspnea: Secondary | ICD-10-CM

## 2024-01-01 LAB — ECHOCARDIOGRAM COMPLETE
Area-P 1/2: 7.16 cm2
S' Lateral: 2.2 cm

## 2024-02-17 ENCOUNTER — Telehealth: Payer: Self-pay | Admitting: Cardiology

## 2024-02-17 DIAGNOSIS — E785 Hyperlipidemia, unspecified: Secondary | ICD-10-CM

## 2024-02-17 NOTE — Addendum Note (Signed)
 Addended by: Lonia Farber on: 02/17/2024 10:14 AM   Modules accepted: Orders

## 2024-02-17 NOTE — Telephone Encounter (Signed)
 Spoke to patient and reviewed Jimmy Footman recommendation to have fasting blood work repeated. Patient verbalized understanding and had no additional questions.

## 2024-09-15 ENCOUNTER — Other Ambulatory Visit: Payer: Self-pay | Admitting: Family

## 2024-09-15 DIAGNOSIS — Z1231 Encounter for screening mammogram for malignant neoplasm of breast: Secondary | ICD-10-CM

## 2024-09-21 ENCOUNTER — Ambulatory Visit
Admission: RE | Admit: 2024-09-21 | Discharge: 2024-09-21 | Disposition: A | Discharge status: Home / Self Care | Source: Ambulatory Visit | Attending: Family | Admitting: Family

## 2024-09-21 DIAGNOSIS — Z1231 Encounter for screening mammogram for malignant neoplasm of breast: Secondary | ICD-10-CM

## 2024-09-24 ENCOUNTER — Other Ambulatory Visit: Payer: Self-pay | Admitting: Family

## 2024-09-24 DIAGNOSIS — R921 Mammographic calcification found on diagnostic imaging of breast: Secondary | ICD-10-CM

## 2024-09-24 DIAGNOSIS — R928 Other abnormal and inconclusive findings on diagnostic imaging of breast: Secondary | ICD-10-CM

## 2024-09-29 ENCOUNTER — Ambulatory Visit
Admission: RE | Admit: 2024-09-29 | Discharge: 2024-09-29 | Disposition: A | Discharge status: Home / Self Care | Source: Ambulatory Visit | Attending: Family | Admitting: Family

## 2024-09-29 ENCOUNTER — Other Ambulatory Visit: Payer: Self-pay | Admitting: Family

## 2024-09-29 DIAGNOSIS — R928 Other abnormal and inconclusive findings on diagnostic imaging of breast: Secondary | ICD-10-CM
